# Patient Record
Sex: Female | Born: 1955
Health system: Southern US, Community
[De-identification: ages and names within clinical notes are randomized; demographics above are authoritative.]

## PROBLEM LIST (undated history)

## (undated) DIAGNOSIS — M8580 Other specified disorders of bone density and structure, unspecified site: Secondary | ICD-10-CM

## (undated) DIAGNOSIS — H269 Unspecified cataract: Secondary | ICD-10-CM

## (undated) DIAGNOSIS — I10 Essential (primary) hypertension: Secondary | ICD-10-CM

## (undated) DIAGNOSIS — K219 Gastro-esophageal reflux disease without esophagitis: Secondary | ICD-10-CM

## (undated) DIAGNOSIS — C801 Malignant (primary) neoplasm, unspecified: Secondary | ICD-10-CM

## (undated) DIAGNOSIS — Z800 Family history of malignant neoplasm of digestive organs: Secondary | ICD-10-CM

## (undated) DIAGNOSIS — R7303 Prediabetes: Secondary | ICD-10-CM

## (undated) DIAGNOSIS — M1990 Unspecified osteoarthritis, unspecified site: Secondary | ICD-10-CM

## (undated) DIAGNOSIS — E039 Hypothyroidism, unspecified: Secondary | ICD-10-CM

## (undated) DIAGNOSIS — Z803 Family history of malignant neoplasm of breast: Secondary | ICD-10-CM

## (undated) DIAGNOSIS — Z923 Personal history of irradiation: Secondary | ICD-10-CM

## (undated) DIAGNOSIS — E785 Hyperlipidemia, unspecified: Secondary | ICD-10-CM

## (undated) HISTORY — DX: Gastro-esophageal reflux disease without esophagitis: K21.9

## (undated) HISTORY — DX: Unspecified cataract: H26.9

## (undated) HISTORY — DX: Malignant (primary) neoplasm, unspecified: C80.1

## (undated) HISTORY — PX: OTHER SURGICAL HISTORY: SHX169

## (undated) HISTORY — DX: Family history of malignant neoplasm of digestive organs: Z80.0

## (undated) HISTORY — PX: TONSILLECTOMY: SUR1361

## (undated) HISTORY — DX: Essential (primary) hypertension: I10

## (undated) HISTORY — DX: Hypothyroidism, unspecified: E03.9

## (undated) HISTORY — DX: Other specified disorders of bone density and structure, unspecified site: M85.80

## (undated) HISTORY — DX: Unspecified osteoarthritis, unspecified site: M19.90

## (undated) HISTORY — DX: Hyperlipidemia, unspecified: E78.5

## (undated) HISTORY — PX: OVARIAN CYST REMOVAL: SHX89

## (undated) HISTORY — PX: COLONOSCOPY: SHX174

## (undated) HISTORY — DX: Family history of malignant neoplasm of breast: Z80.3

## (undated) HISTORY — PX: BREAST LUMPECTOMY: SHX2

---

## 1998-03-01 ENCOUNTER — Encounter: Payer: Self-pay | Admitting: Internal Medicine

## 1998-03-01 ENCOUNTER — Ambulatory Visit (HOSPITAL_COMMUNITY): Admission: RE | Admit: 1998-03-01 | Discharge: 1998-03-01 | Payer: Self-pay | Admitting: Internal Medicine

## 1999-03-20 ENCOUNTER — Encounter: Payer: Self-pay | Admitting: Internal Medicine

## 1999-03-20 ENCOUNTER — Ambulatory Visit (HOSPITAL_COMMUNITY): Admission: RE | Admit: 1999-03-20 | Discharge: 1999-03-20 | Payer: Self-pay | Admitting: Internal Medicine

## 1999-03-30 ENCOUNTER — Encounter: Payer: Self-pay | Admitting: Internal Medicine

## 1999-03-30 ENCOUNTER — Ambulatory Visit (HOSPITAL_COMMUNITY): Admission: RE | Admit: 1999-03-30 | Discharge: 1999-03-30 | Payer: Self-pay | Admitting: Internal Medicine

## 1999-05-04 ENCOUNTER — Other Ambulatory Visit: Admission: RE | Admit: 1999-05-04 | Discharge: 1999-05-04 | Payer: Self-pay | Admitting: *Deleted

## 1999-05-08 ENCOUNTER — Encounter: Payer: Self-pay | Admitting: *Deleted

## 1999-05-08 ENCOUNTER — Encounter: Admission: RE | Admit: 1999-05-08 | Discharge: 1999-05-08 | Payer: Self-pay | Admitting: *Deleted

## 1999-12-19 ENCOUNTER — Encounter: Admission: RE | Admit: 1999-12-19 | Discharge: 1999-12-19 | Payer: Self-pay | Admitting: *Deleted

## 1999-12-19 ENCOUNTER — Encounter: Payer: Self-pay | Admitting: *Deleted

## 2000-04-04 ENCOUNTER — Encounter: Payer: Self-pay | Admitting: Internal Medicine

## 2000-04-04 ENCOUNTER — Ambulatory Visit (HOSPITAL_COMMUNITY): Admission: RE | Admit: 2000-04-04 | Discharge: 2000-04-04 | Payer: Self-pay | Admitting: Internal Medicine

## 2000-06-06 ENCOUNTER — Other Ambulatory Visit: Admission: RE | Admit: 2000-06-06 | Discharge: 2000-06-06 | Payer: Self-pay | Admitting: *Deleted

## 2000-08-23 ENCOUNTER — Encounter (INDEPENDENT_AMBULATORY_CARE_PROVIDER_SITE_OTHER): Payer: Self-pay | Admitting: Specialist

## 2000-08-23 ENCOUNTER — Other Ambulatory Visit: Admission: RE | Admit: 2000-08-23 | Discharge: 2000-08-23 | Payer: Self-pay | Admitting: *Deleted

## 2001-04-11 ENCOUNTER — Encounter: Payer: Self-pay | Admitting: Internal Medicine

## 2001-04-11 ENCOUNTER — Ambulatory Visit (HOSPITAL_COMMUNITY): Admission: RE | Admit: 2001-04-11 | Discharge: 2001-04-11 | Payer: Self-pay | Admitting: Internal Medicine

## 2001-06-11 ENCOUNTER — Other Ambulatory Visit: Admission: RE | Admit: 2001-06-11 | Discharge: 2001-06-11 | Payer: Self-pay | Admitting: *Deleted

## 2002-04-13 ENCOUNTER — Ambulatory Visit (HOSPITAL_COMMUNITY): Admission: RE | Admit: 2002-04-13 | Discharge: 2002-04-13 | Payer: Self-pay | Admitting: *Deleted

## 2002-04-13 ENCOUNTER — Encounter: Payer: Self-pay | Admitting: *Deleted

## 2002-07-02 ENCOUNTER — Other Ambulatory Visit: Admission: RE | Admit: 2002-07-02 | Discharge: 2002-07-02 | Payer: Self-pay | Admitting: *Deleted

## 2003-04-14 ENCOUNTER — Ambulatory Visit (HOSPITAL_COMMUNITY): Admission: RE | Admit: 2003-04-14 | Discharge: 2003-04-14 | Payer: Self-pay | Admitting: *Deleted

## 2003-07-20 ENCOUNTER — Other Ambulatory Visit: Admission: RE | Admit: 2003-07-20 | Discharge: 2003-07-20 | Payer: Self-pay | Admitting: *Deleted

## 2004-04-14 ENCOUNTER — Ambulatory Visit (HOSPITAL_COMMUNITY): Admission: RE | Admit: 2004-04-14 | Discharge: 2004-04-14 | Payer: Self-pay | Admitting: *Deleted

## 2004-04-28 ENCOUNTER — Encounter: Admission: RE | Admit: 2004-04-28 | Discharge: 2004-04-28 | Payer: Self-pay | Admitting: *Deleted

## 2004-07-26 ENCOUNTER — Other Ambulatory Visit: Admission: RE | Admit: 2004-07-26 | Discharge: 2004-07-26 | Payer: Self-pay | Admitting: *Deleted

## 2004-10-27 ENCOUNTER — Encounter: Admission: RE | Admit: 2004-10-27 | Discharge: 2004-10-27 | Payer: Self-pay | Admitting: *Deleted

## 2005-04-16 ENCOUNTER — Encounter: Admission: RE | Admit: 2005-04-16 | Discharge: 2005-04-16 | Payer: Self-pay | Admitting: *Deleted

## 2005-08-01 ENCOUNTER — Other Ambulatory Visit: Admission: RE | Admit: 2005-08-01 | Discharge: 2005-08-01 | Payer: Self-pay | Admitting: *Deleted

## 2005-11-21 LAB — HM COLONOSCOPY

## 2006-04-19 ENCOUNTER — Encounter: Admission: RE | Admit: 2006-04-19 | Discharge: 2006-04-19 | Payer: Self-pay | Admitting: *Deleted

## 2006-08-20 ENCOUNTER — Other Ambulatory Visit: Admission: RE | Admit: 2006-08-20 | Discharge: 2006-08-20 | Payer: Self-pay | Admitting: *Deleted

## 2007-04-21 ENCOUNTER — Encounter: Admission: RE | Admit: 2007-04-21 | Discharge: 2007-04-21 | Payer: Self-pay | Admitting: *Deleted

## 2008-04-30 ENCOUNTER — Encounter: Admission: RE | Admit: 2008-04-30 | Discharge: 2008-04-30 | Payer: Self-pay | Admitting: Obstetrics & Gynecology

## 2009-05-02 ENCOUNTER — Encounter: Admission: RE | Admit: 2009-05-02 | Discharge: 2009-05-02 | Payer: Self-pay | Admitting: Obstetrics & Gynecology

## 2009-10-05 ENCOUNTER — Encounter: Admission: RE | Admit: 2009-10-05 | Discharge: 2009-10-05 | Payer: Self-pay | Admitting: Obstetrics & Gynecology

## 2010-03-27 ENCOUNTER — Other Ambulatory Visit: Payer: Self-pay | Admitting: Obstetrics & Gynecology

## 2010-03-27 DIAGNOSIS — Z1231 Encounter for screening mammogram for malignant neoplasm of breast: Secondary | ICD-10-CM

## 2010-04-26 ENCOUNTER — Encounter: Payer: Self-pay | Admitting: Family Medicine

## 2010-04-26 ENCOUNTER — Ambulatory Visit (INDEPENDENT_AMBULATORY_CARE_PROVIDER_SITE_OTHER): Payer: 59 | Admitting: Family Medicine

## 2010-04-26 DIAGNOSIS — E785 Hyperlipidemia, unspecified: Secondary | ICD-10-CM | POA: Insufficient documentation

## 2010-04-26 DIAGNOSIS — K219 Gastro-esophageal reflux disease without esophagitis: Secondary | ICD-10-CM | POA: Insufficient documentation

## 2010-04-26 DIAGNOSIS — E039 Hypothyroidism, unspecified: Secondary | ICD-10-CM | POA: Insufficient documentation

## 2010-04-26 DIAGNOSIS — Z136 Encounter for screening for cardiovascular disorders: Secondary | ICD-10-CM

## 2010-04-26 DIAGNOSIS — I10 Essential (primary) hypertension: Secondary | ICD-10-CM | POA: Insufficient documentation

## 2010-04-26 LAB — HEPATIC FUNCTION PANEL
ALT: 41 U/L — ABNORMAL HIGH (ref 0–35)
AST: 33 U/L (ref 0–37)
Bilirubin, Direct: 0.1 mg/dL (ref 0.0–0.3)
Total Bilirubin: 1.2 mg/dL (ref 0.3–1.2)

## 2010-04-26 LAB — BASIC METABOLIC PANEL
BUN: 13 mg/dL (ref 6–23)
Calcium: 9.6 mg/dL (ref 8.4–10.5)
Creatinine, Ser: 0.8 mg/dL (ref 0.4–1.2)
GFR: 75.29 mL/min (ref >60.00)
Glucose, Bld: 91 mg/dL (ref 70–99)

## 2010-04-26 MED ORDER — OMEPRAZOLE 40 MG PO CPDR: 40.0000 mg | DELAYED_RELEASE_CAPSULE | Freq: Every day | ORAL | Status: DC

## 2010-04-26 NOTE — Assessment & Plan Note (Signed)
Asymptomatic. Continue current dose of synthroid. Recheck TSH today.

## 2010-04-26 NOTE — Assessment & Plan Note (Signed)
Continue current dose of Amlodipine. BMET today.

## 2010-04-26 NOTE — Assessment & Plan Note (Signed)
Deteriorated. Will refer to her gastroenterologist, Dr. Madilyn Fireman. Restart Omeprazole.

## 2010-04-26 NOTE — Progress Notes (Signed)
55 yo here to establish care.    GERD- feels like it is getting worse.  Per pt, had endoscopy 5 years ago that was normal. Used to take Omeprazole but stopped taking it.  Has symptoms almost daily, usually worse after drinking Tea or eating spicy foods. No n/v/d or changes in her stool.  HTN- has been stable on Amlodipine 10 mg daily for over 10 years.  Was on lisinopril as well but became hypotensive.  Pt reports that she has been normotensive but is nervous about visit today.  Hypothyroidism- has been stable on current dose of synthroid for several years.  Denies any symptoms of hypo or hyperthyroidism.  Has OBGYN, Dr. Seymour Bars.  Mammogram scheduled for next week.  The PMH, PSH, Social History, Family History, Medications, and allergies have been reviewed in [facility], and have been updated if relevant.  General: Denies fever, chills, sweats. No significant weight loss. Eyes: Denies blurring,significant itching ENT: Denies earache, sore throat, and hoarseness.  Cardiovascular: Denies chest pains, palpitations, dyspnea on exertion,  Respiratory: Denies cough, dyspnea at rest,wheeezing Breast: no concerns about lumps GI: Denies nausea, vomiting, diarrhea, constipation, change in bowel habits, abdominal pain, melena, hematochezia GU: Denies dysuria, hematuria, urinary hesitancy, nocturia, denies STD risk, no concerns about discharge Musculoskeletal: Denies back pain, joint pain Derm: Denies rash, itching Neuro: Denies  paresthesias, frequent falls, frequent headaches Psych: Denies depression, anxiety Endocrine: Denies cold intolerance, heat intolerance, polydipsia Heme: Denies enlarged lymph nodes Allergy: No hayfever  Physical exam BP 140/80  Pulse 60  Temp(Src) 98.8 F (37.1 C) (Oral)  Ht 5\' 8"  (1.727 m)  Wt 220 lb 1.9 oz (99.846 kg)  BMI 33.47 kg/m2  General:  Overweight appearing, pleasant female,in no acute distress; alert,appropriate and cooperative throughout examination Head:   normocephalic and atraumatic.   Eyes:  vision grossly intact, pupils equal, pupils round, and pupils reactive to light.   Ears:  R ear normal and L ear normal.   Nose:  no external deformity.   Mouth:  good dentition.   Neck:  No deformities, masses, or tenderness noted. Lungs:  Normal respiratory effort, chest expands symmetrically. Lungs are clear to auscultation, no crackles or wheezes. Heart:  Normal rate and regular rhythm. S1 and S2 normal without gallop, murmur, click, rub or other extra sounds. Abdomen:  Bowel sounds positive,abdomen soft and non-tender without masses, organomegaly or hernias noted. Msk:  No deformity or scoliosis noted of thoracic or lumbar spine.   Extremities:  No clubbing, cyanosis, edema, or deformity noted with normal full range of motion of all joints.   Neurologic:  alert & oriented X3 and gait normal.   Skin:  Intact without suspicious lesions or rashes Psych:  Cognition and judgment appear intact. Alert and cooperative with normal attention span and concentration. No apparent delusions, illusions, hallucinations

## 2010-04-26 NOTE — Patient Instructions (Signed)
Great to meet you. Please stop by to see Elizabeth Fitzpatrick on your way out. 

## 2010-04-26 NOTE — Assessment & Plan Note (Signed)
On Zocor. Recheck FLP, hepatic panel today.

## 2010-05-04 ENCOUNTER — Ambulatory Visit
Admission: RE | Admit: 2010-05-04 | Discharge: 2010-05-04 | Disposition: A | Discharge status: Home / Self Care | Payer: 59 | Source: Ambulatory Visit | Attending: Obstetrics & Gynecology | Admitting: Obstetrics & Gynecology

## 2010-05-04 DIAGNOSIS — Z1231 Encounter for screening mammogram for malignant neoplasm of breast: Secondary | ICD-10-CM

## 2010-07-11 ENCOUNTER — Other Ambulatory Visit: Payer: Self-pay | Admitting: *Deleted

## 2010-07-11 MED ORDER — AMLODIPINE BESYLATE 10 MG PO TABS: 10.0000 mg | ORAL_TABLET | Freq: Every day | ORAL | Status: DC

## 2010-08-10 ENCOUNTER — Ambulatory Visit (INDEPENDENT_AMBULATORY_CARE_PROVIDER_SITE_OTHER): Payer: 59 | Admitting: Family Medicine

## 2010-08-10 ENCOUNTER — Encounter: Payer: Self-pay | Admitting: Family Medicine

## 2010-08-10 VITALS — BP 122/80 | HR 59 | Temp 97.3°F | Ht 68.0 in | Wt 207.5 lb

## 2010-08-10 DIAGNOSIS — E785 Hyperlipidemia, unspecified: Secondary | ICD-10-CM

## 2010-08-10 DIAGNOSIS — R233 Spontaneous ecchymoses: Secondary | ICD-10-CM

## 2010-08-10 DIAGNOSIS — I10 Essential (primary) hypertension: Secondary | ICD-10-CM

## 2010-08-10 DIAGNOSIS — Z23 Encounter for immunization: Secondary | ICD-10-CM

## 2010-08-10 LAB — HEPATIC FUNCTION PANEL
Albumin: 5 g/dL (ref 3.5–5.2)
Alkaline Phosphatase: 77 U/L (ref 39–117)
Bilirubin, Direct: 0.1 mg/dL (ref 0.0–0.3)

## 2010-08-10 LAB — CBC WITH DIFFERENTIAL/PLATELET
Basophils Absolute: 0 10*3/uL (ref 0.0–0.1)
Basophils Relative: 0.6 % (ref 0.0–3.0)
HCT: 45.7 % (ref 36.0–46.0)
Hemoglobin: 15.6 g/dL — ABNORMAL HIGH (ref 12.0–15.0)
Lymphs Abs: 1.6 10*3/uL (ref 0.7–4.0)
MCHC: 34.1 g/dL (ref 30.0–36.0)
Monocytes Relative: 11.1 % (ref 3.0–12.0)
Neutro Abs: 3.4 10*3/uL (ref 1.4–7.7)
RBC: 4.9 Mil/uL (ref 3.87–5.11)
RDW: 12.2 % (ref 11.5–14.6)

## 2010-08-10 LAB — LIPID PANEL
Total CHOL/HDL Ratio: 4
VLDL: 21 mg/dL (ref 0.0–40.0)

## 2010-08-10 NOTE — Progress Notes (Signed)
Addended by: Gilmer Mor on: 08/10/2010 08:36 AM   Modules accepted: Orders

## 2010-08-10 NOTE — Progress Notes (Signed)
55 yo here for 3 month follow up.   HTN- has been stable on Amlodipine 10 mg daily for over 10 years.  Was on lisinopril as well but became hypotensive.   Was a little hypertensive at last office visit. BP Readings from Last 3 Encounters:  04/26/10 140/80    Wt Readings from Last 3 Encounters:  08/10/10 207 lb 8 oz (94.121 kg)  04/26/10 220 lb 1.9 oz (99.846 kg)    Hypothyroidism- has been stable on current dose of synthroid for several years.  Denies any symptoms of hypo or hyperthyroidism. Lab Results  Component Value Date   TSH 3.03 04/26/2010    HLD- lipids were elevated in April. LDL 163, had not been taking her Zocor regularly. Restarted since her last office visit.  Red dots- noticed red dots on her legs bilaterally for several months. Not itchy or painful. No bleeding from nose, in urine or stool.  Patient Active Problem List  Diagnoses  . GERD (gastroesophageal reflux disease)  . Hypertension  . Hypothyroidism  . Hyperlipidemia   Past Medical History  Diagnosis Date  . GERD (gastroesophageal reflux disease)   . Hyperlipidemia   . Hypertension   . Hypothyroidism    Past Surgical History  Procedure Date  . Pilonidal cyst removal 1980s  . Tonsillectomy   . Ovarian cyst removal    History  Substance Use Topics  . Smoking status: Never Smoker   . Smokeless tobacco: Not on file  . Alcohol Use: Not on file   No family history on file. Allergies  Allergen Reactions  . Sulfa Antibiotics Hives and Swelling   Current Outpatient Prescriptions on File Prior to Visit  Medication Sig Dispense Refill  . amLODipine (NORVASC) 10 MG tablet Take 1 tablet (10 mg total) by mouth daily.  7 tablet  0  . Azelaic Acid (FINACEA EX) Use as directed       . Calcium-Vitamin D-Vitamin K (CALCIUM SOFT CHEWS) 500-100-40 MG-UNT-MCG CHEW Chew 1 tablet by mouth daily.        . Levothyroxine Sodium 75 MCG CAPS Take 1 capsule by mouth daily.        . Multiple Vitamin (MULTIVITAMINS  PO) Take 1 tablet by mouth daily.        Marland Kitchen omeprazole (PRILOSEC) 40 MG capsule Take 1 capsule (40 mg total) by mouth daily.  90 capsule  3  . simvastatin (ZOCOR) 20 MG tablet Take 20 mg by mouth at bedtime.         The PMH, PSH, Social History, Family History, Medications, and allergies have been reviewed in [facility], and have been updated if relevant.   ROS: General: Denies fever, chills, sweats. No significant weight loss. Eyes: Denies blurring,significant itching ENT: Denies earache, sore throat, and hoarseness.  Cardiovascular: Denies chest pains, palpitations, dyspnea on exertion,  Respiratory: Denies cough, dyspnea at rest,wheeezing Breast: no concerns about lumps GI: Denies nausea, vomiting, diarrhea, constipation, change in bowel habits, abdominal pain, melena, hematochezia GU: Denies dysuria, hematuria, urinary hesitancy, nocturia, denies STD risk, no concerns about discharge Musculoskeletal: Denies back pain, joint pain Neuro: Denies  paresthesias, frequent falls, frequent headaches Psych: Denies depression, anxiety Endocrine: Denies cold intolerance, heat intolerance, polydipsia Heme: Denies enlarged lymph nodes Allergy: No hayfever  Physical exam BP 122/80  Pulse 59  Temp(Src) 97.3 F (36.3 C) (Oral)  Ht 5\' 8"  (1.727 m)  Wt 207 lb 8 oz (94.121 kg)  BMI 31.55 kg/m2  General:  Overweight appearing, pleasant female,in no acute  distress; alert,appropriate and cooperative throughout examination Head:  normocephalic and atraumatic.   Eyes:  vision grossly intact, pupils equal, pupils round, and pupils reactive to light.   Ears:  R ear normal and L ear normal.   Nose:  no external deformity.   Mouth:  good dentition.   Neck:  No deformities, masses, or tenderness noted. Lungs:  Normal respiratory effort, chest expands symmetrically. Lungs are clear to auscultation, no crackles or wheezes. Heart:  Normal rate and regular rhythm. S1 and S2 normal without gallop, murmur, click,  rub or other extra sounds. Abdomen:  Bowel sounds positive,abdomen soft and non-tender without masses, organomegaly or hernias noted. Msk:  No deformity or scoliosis noted of thoracic or lumbar spine.   Extremities:  No clubbing, cyanosis, edema, or deformity noted with normal full range of motion of all joints.   Neurologic:  alert & oriented X3 and gait normal.   Skin:  Intact without suspicious lesions or rashes, +non blanchable petechia Psych:  Cognition and judgment appear intact. Alert and cooperative with normal attention span and concentration. No apparent delusions, illusions, hallucinations  Assessment and Plan: 1. Hyperlipidemia  Recheck lipid panel, hepatic panel today.  2. Hypertension  Improved.  Continue current meds.   3. Petechiae  CBC w/Diff  Etiology unclear.  Check CBC today.

## 2010-10-17 ENCOUNTER — Telehealth: Payer: Self-pay | Admitting: *Deleted

## 2010-10-17 NOTE — Telephone Encounter (Signed)
Yes ok to check yearly.  If she has made changes in her diet or she is concerned, we can check every 6 months

## 2010-10-17 NOTE — Telephone Encounter (Signed)
Pt states she has been on simvastatin for 4-5 years.  She had her lipids and liver functions checked when she came here as a new patient in July.  She asks how often she should have that checked, she thinks every 3-6 months, I advised her ok to check yearly after the initial 6 week check.  Please advise.

## 2010-10-17 NOTE — Telephone Encounter (Signed)
Left message on personal voicemail at work advising patient as instructed.

## 2011-01-18 ENCOUNTER — Other Ambulatory Visit: Payer: Self-pay | Admitting: Internal Medicine

## 2011-01-18 MED ORDER — SIMVASTATIN 20 MG PO TABS: 20.0000 mg | ORAL_TABLET | Freq: Every day | ORAL | Status: DC

## 2011-01-18 MED ORDER — AMLODIPINE BESYLATE 10 MG PO TABS: 10.0000 mg | ORAL_TABLET | Freq: Every day | ORAL | Status: DC

## 2011-01-18 MED ORDER — LEVOTHYROXINE SODIUM 75 MCG PO CAPS: 1.0000 | ORAL_CAPSULE | Freq: Every day | ORAL | Status: DC

## 2011-01-18 NOTE — Telephone Encounter (Signed)
Refill request sent 90 day supply to [facility] and because it takes 7 to 14 days for patient to receive medications I sent 14 days worth to local pharmacy.

## 2011-01-31 ENCOUNTER — Other Ambulatory Visit: Payer: Self-pay | Admitting: Internal Medicine

## 2011-01-31 MED ORDER — LEVOTHYROXINE SODIUM 75 MCG PO CAPS: 1.0000 | ORAL_CAPSULE | Freq: Every day | ORAL | Status: DC

## 2011-01-31 NOTE — Telephone Encounter (Signed)
Sent Rx to pharmacy  

## 2011-02-09 ENCOUNTER — Other Ambulatory Visit: Payer: Self-pay | Admitting: *Deleted

## 2011-02-09 MED ORDER — LEVOTHYROXINE SODIUM 75 MCG PO CAPS: 1.0000 | ORAL_CAPSULE | Freq: Every day | ORAL | Status: DC

## 2011-02-09 NOTE — Telephone Encounter (Signed)
Patient called to request a 14 day supply of Levothyroxine be sent to [facility], she also stated that she has not received her 90 day supply from [facility].  I called [facility] and spoke to a pharmacist and she stated that patient has been receiving tablets and we sent in a Rx for capsules and they wanted to make sure patient can take the tablets.  Tablets authorized, patient should be getting her Rx sent out on Tuesday.

## 2011-03-29 ENCOUNTER — Other Ambulatory Visit: Payer: Self-pay | Admitting: Obstetrics

## 2011-03-29 DIAGNOSIS — Z1231 Encounter for screening mammogram for malignant neoplasm of breast: Secondary | ICD-10-CM

## 2011-05-07 ENCOUNTER — Ambulatory Visit: Payer: 59

## 2011-05-08 ENCOUNTER — Ambulatory Visit
Admission: RE | Admit: 2011-05-08 | Discharge: 2011-05-08 | Disposition: A | Discharge status: Home / Self Care | Payer: 59 | Source: Ambulatory Visit | Attending: Obstetrics | Admitting: Obstetrics

## 2011-05-08 DIAGNOSIS — Z1231 Encounter for screening mammogram for malignant neoplasm of breast: Secondary | ICD-10-CM

## 2011-08-15 ENCOUNTER — Ambulatory Visit (INDEPENDENT_AMBULATORY_CARE_PROVIDER_SITE_OTHER): Payer: 59 | Admitting: Family Medicine

## 2011-08-15 ENCOUNTER — Encounter: Payer: Self-pay | Admitting: Family Medicine

## 2011-08-15 VITALS — BP 136/80 | HR 80 | Temp 98.0°F | Ht 68.0 in | Wt 218.0 lb

## 2011-08-15 DIAGNOSIS — I10 Essential (primary) hypertension: Secondary | ICD-10-CM

## 2011-08-15 DIAGNOSIS — E785 Hyperlipidemia, unspecified: Secondary | ICD-10-CM

## 2011-08-15 DIAGNOSIS — Z0000 Encounter for general adult medical examination without abnormal findings: Secondary | ICD-10-CM | POA: Insufficient documentation

## 2011-08-15 DIAGNOSIS — E039 Hypothyroidism, unspecified: Secondary | ICD-10-CM

## 2011-08-15 LAB — TSH: TSH: 2.58 u[IU]/mL (ref 0.35–5.50)

## 2011-08-15 LAB — COMPREHENSIVE METABOLIC PANEL
ALT: 36 U/L — ABNORMAL HIGH (ref 0–35)
AST: 29 U/L (ref 0–37)
Alkaline Phosphatase: 67 U/L (ref 39–117)
Creatinine, Ser: 0.8 mg/dL (ref 0.4–1.2)
Total Bilirubin: 1 mg/dL (ref 0.3–1.2)

## 2011-08-15 LAB — LIPID PANEL
HDL: 47.6 mg/dL (ref >39.00)
Triglycerides: 164 mg/dL — ABNORMAL HIGH (ref 0.0–149.0)
VLDL: 32.8 mg/dL (ref 0.0–40.0)

## 2011-08-15 LAB — LDL CHOLESTEROL, DIRECT: Direct LDL: 129.1 mg/dL

## 2011-08-15 MED ORDER — AMLODIPINE BESYLATE 10 MG PO TABS: 10.0000 mg | ORAL_TABLET | Freq: Every day | ORAL | Status: DC

## 2011-08-15 MED ORDER — SIMVASTATIN 20 MG PO TABS: 20.0000 mg | ORAL_TABLET | Freq: Every day | ORAL | Status: DC

## 2011-08-15 MED ORDER — OMEPRAZOLE 40 MG PO CPDR: 40.0000 mg | DELAYED_RELEASE_CAPSULE | Freq: Every day | ORAL | Status: DC

## 2011-08-15 MED ORDER — LEVOTHYROXINE SODIUM 75 MCG PO CAPS: 1.0000 | ORAL_CAPSULE | Freq: Every day | ORAL | Status: DC

## 2011-08-15 NOTE — Progress Notes (Signed)
56 yo here for CPX, no pap.  Sees Dr. Ernestina Penna in the fall.  Had a colonoscopy a few years ago- sees Dr. Madilyn Fireman.   HTN- has been stable on Amlodipine 10 mg daily for over 10 years.  Was on lisinopril as well but became hypotensive.    BP Readings from Last 3 Encounters:  08/10/10 122/80  04/26/10 140/80    Wt Readings from Last 3 Encounters:  08/10/10 207 lb 8 oz (94.121 kg)  04/26/10 220 lb 1.9 oz (99.846 kg)    Hypothyroidism- has been stable on current dose of synthroid for several years.  Denies any symptoms of hypo or hyperthyroidism. Lab Results  Component Value Date   TSH 3.03 04/26/2010    HLD- on zocor. Lab Results  Component Value Date   CHOL 214* 08/10/2010   HDL 54.10 08/10/2010   LDLDIRECT 148.4 08/10/2010   TRIG 105.0 08/10/2010   CHOLHDL 4 08/10/2010  Obesity- has tried every diet.  She is aware that she has an addicition to food but knows what she needs to do. Does not want medication and nutrition referral.  Patient Active Problem List  Diagnosis  . GERD (gastroesophageal reflux disease)  . Hypertension  . Hypothyroidism  . Hyperlipidemia  . Petechiae  . Routine general medical examination at a health care facility   Past Medical History  Diagnosis Date  . GERD (gastroesophageal reflux disease)   . Hyperlipidemia   . Hypertension   . Hypothyroidism    Past Surgical History  Procedure Date  . Pilonidal cyst removal 1980s  . Tonsillectomy   . Ovarian cyst removal    History  Substance Use Topics  . Smoking status: Never Smoker   . Smokeless tobacco: Not on file  . Alcohol Use: Not on file   No family history on file. Allergies  Allergen Reactions  . Sulfa Antibiotics Hives and Swelling   Current Outpatient Prescriptions on File Prior to Visit  Medication Sig Dispense Refill  . amLODipine (NORVASC) 10 MG tablet Take 1 tablet (10 mg total) by mouth daily.  90 tablet  3  . amLODipine (NORVASC) 10 MG tablet Take 1 tablet (10 mg total) by mouth  daily.  14 tablet  0  . Azelaic Acid (FINACEA EX) Use as directed       . Calcium-Vitamin D-Vitamin K (CALCIUM SOFT CHEWS) 500-100-40 MG-UNT-MCG CHEW Chew 1 tablet by mouth daily.        . Levothyroxine Sodium 75 MCG CAPS Take 1 capsule (75 mcg total) by mouth daily.  14 capsule  0  . Multiple Vitamin (MULTIVITAMINS PO) Take 1 tablet by mouth daily.        Marland Kitchen omeprazole (PRILOSEC) 40 MG capsule Take 1 capsule (40 mg total) by mouth daily.  90 capsule  3  . simvastatin (ZOCOR) 20 MG tablet Take 1 tablet (20 mg total) by mouth at bedtime.  90 tablet  3  . simvastatin (ZOCOR) 20 MG tablet Take 1 tablet (20 mg total) by mouth at bedtime.  14 tablet  0   The PMH, PSH, Social History, Family History, Medications, and allergies have been reviewed in [facility], and have been updated if relevant.   ROS:  Physical exam BP 136/80  Pulse 80  Temp 98 F (36.7 C)  Ht 5\' 8"  (1.727 m)  Wt 218 lb (98.884 kg)  BMI 33.15 kg/m2   General:  Well-developed,well-nourished,in no acute distress; alert,appropriate and cooperative throughout examination Head:  normocephalic and atraumatic.   Eyes:  vision grossly intact, pupils equal, pupils round, and pupils reactive to light.   Ears:  R ear normal and L ear normal.   Nose:  no external deformity.   Mouth:  good dentition.   Lungs:  Normal respiratory effort, chest expands symmetrically. Lungs are clear to auscultation, no crackles or wheezes. Heart:  Normal rate and regular rhythm. S1 and S2 normal without gallop, murmur, click, rub or other extra sounds. Abdomen:  Bowel sounds positive,abdomen soft and non-tender without masses, organomegaly or hernias noted. Small firm, palpable subcutaneous nodule RLQ abdomen (per pt has been there for months to years). Msk:  No deformity or scoliosis noted of thoracic or lumbar spine.   Extremities:  No clubbing, cyanosis, edema, or deformity noted with normal full range of motion of all joints.   Neurologic:  alert &  oriented X3 and gait normal.   Skin:  Intact without suspicious lesions or rashes Cervical Nodes:  No lymphadenopathy noted Axillary Nodes:  No palpable lymphadenopathy Psych:  Cognition and judgment appear intact. Alert and cooperative with normal attention span and concentration. No apparent delusions, illusions, hallucinations   Assessment and Plan:  1. Routine general medical examination at a health care facility  Reviewed preventive care protocols, scheduled due services, and updated immunizations Discussed nutrition, exercise, diet, and healthy lifestyle.  Comprehensive metabolic panel  2. Hyperlipidemia  On zocor. Recheck labs today. Lipid Panel  3. Hypothyroidism  On levothyroxine. Recheck labs today. TSH  4. Hypertension  Stable on amlodipine.

## 2011-08-15 NOTE — Patient Instructions (Addendum)
Great to see you.  We will call you with your lab results. 

## 2011-08-21 ENCOUNTER — Encounter: Payer: Self-pay | Admitting: Family Medicine

## 2012-01-28 ENCOUNTER — Telehealth: Payer: Self-pay

## 2012-01-28 NOTE — Telephone Encounter (Signed)
Pt left v/m has been talking with [facility] billing dept re: wellness exam done on 2010/05/23. Pt said visit was not coded correctly and request Dr Elmer Sow representative to call [facility] billing  Jonette Pesa at [phone] ext [phone].Please advise.

## 2012-01-31 NOTE — Telephone Encounter (Signed)
Spoke with the patient for more information.  Asked Dr. Dayton Martes if any visits in 2012 were documented for a CPE to be charged.  She agreed that the July 19,2012 visit could be classified as a CPE.  E-mailed charge correction and asked that a CPE CPT code be billed and a CPE dx. Called patient back to notify her the change had been made.

## 2012-04-08 ENCOUNTER — Other Ambulatory Visit: Payer: Self-pay | Admitting: Obstetrics

## 2012-04-08 ENCOUNTER — Other Ambulatory Visit: Payer: Self-pay

## 2012-04-08 DIAGNOSIS — Z1231 Encounter for screening mammogram for malignant neoplasm of breast: Secondary | ICD-10-CM

## 2012-05-15 ENCOUNTER — Ambulatory Visit: Payer: 59

## 2012-05-19 ENCOUNTER — Ambulatory Visit
Admission: RE | Admit: 2012-05-19 | Discharge: 2012-05-19 | Disposition: A | Discharge status: Home / Self Care | Payer: 59 | Source: Ambulatory Visit

## 2012-05-19 DIAGNOSIS — Z1231 Encounter for screening mammogram for malignant neoplasm of breast: Secondary | ICD-10-CM

## 2012-09-17 ENCOUNTER — Ambulatory Visit (INDEPENDENT_AMBULATORY_CARE_PROVIDER_SITE_OTHER): Payer: 59 | Admitting: Family Medicine

## 2012-09-17 ENCOUNTER — Encounter: Payer: Self-pay | Admitting: Family Medicine

## 2012-09-17 VITALS — BP 140/80 | HR 64 | Temp 98.1°F | Ht 68.0 in | Wt 209.0 lb

## 2012-09-17 DIAGNOSIS — Z0000 Encounter for general adult medical examination without abnormal findings: Secondary | ICD-10-CM

## 2012-09-17 DIAGNOSIS — E785 Hyperlipidemia, unspecified: Secondary | ICD-10-CM

## 2012-09-17 DIAGNOSIS — E039 Hypothyroidism, unspecified: Secondary | ICD-10-CM

## 2012-09-17 DIAGNOSIS — I10 Essential (primary) hypertension: Secondary | ICD-10-CM

## 2012-09-17 DIAGNOSIS — K219 Gastro-esophageal reflux disease without esophagitis: Secondary | ICD-10-CM

## 2012-09-17 DIAGNOSIS — Z1211 Encounter for screening for malignant neoplasm of colon: Secondary | ICD-10-CM

## 2012-09-17 LAB — CBC WITH DIFFERENTIAL/PLATELET
Basophils Relative: 0.7 % (ref 0.0–3.0)
Eosinophils Absolute: 0.3 10*3/uL (ref 0.0–0.7)
Eosinophils Relative: 4.6 % (ref 0.0–5.0)
Lymphocytes Relative: 24.5 % (ref 12.0–46.0)
MCHC: 34.8 g/dL (ref 30.0–36.0)
Monocytes Relative: 9.9 % (ref 3.0–12.0)
Neutrophils Relative %: 60.3 % (ref 43.0–77.0)
RBC: 5.03 Mil/uL (ref 3.87–5.11)
WBC: 6.6 10*3/uL (ref 4.5–10.5)

## 2012-09-17 MED ORDER — OMEPRAZOLE 40 MG PO CPDR
40.0000 mg | DELAYED_RELEASE_CAPSULE | Freq: Every day | ORAL | Status: DC
Start: 2012-09-17 — End: 2013-12-10

## 2012-09-17 MED ORDER — OMEPRAZOLE 40 MG PO CPDR: 40.0000 mg | DELAYED_RELEASE_CAPSULE | Freq: Every day | ORAL | Status: DC

## 2012-09-17 MED ORDER — SIMVASTATIN 20 MG PO TABS: 20.0000 mg | ORAL_TABLET | Freq: Every day | ORAL | Status: DC

## 2012-09-17 NOTE — Patient Instructions (Addendum)
Great to see you. We will call you with your lab results and you can view them online.  Please pick up your stool cards.

## 2012-09-17 NOTE — Progress Notes (Signed)
57 yo pleasant female here for CPX, no pap.  Sees Dr. Ernestina Penna in the fall.  Had a colonoscopy a few years ago (before she turned 18)- sees Dr. Madilyn Fireman.  Was told she did not have to have another one for 10 years.  She denies any blood in her stool or changes in her bowel habits.  Bowels are very regular.  HTN- has been stable on Amlodipine 10 mg daily for over 10 years.  Was on lisinopril as well but became hypotensive.     Hypothyroidism- has been stable on current dose of synthroid for several years.  Denies any symptoms of hypo or hyperthyroidism. Lab Results  Component Value Date   TSH 2.58 08/15/2011    HLD- on zocor but ran out several months ago. Lab Results  Component Value Date   CHOL 216* 08/15/2011   HDL 47.60 08/15/2011   LDLDIRECT 129.1 08/15/2011   TRIG 164.0* 08/15/2011   CHOLHDL 5 08/15/2011   Patient Active Problem List   Diagnosis Date Noted  . Routine general medical examination at a health care facility 08/15/2011  . Petechiae 08/10/2010  . GERD (gastroesophageal reflux disease) 04/26/2010  . Hypertension 04/26/2010  . Hypothyroidism 04/26/2010  . Hyperlipidemia 04/26/2010   Past Medical History  Diagnosis Date  . GERD (gastroesophageal reflux disease)   . Hyperlipidemia   . Hypertension   . Hypothyroidism    Past Surgical History  Procedure Laterality Date  . Pilonidal cyst removal  1980s  . Tonsillectomy    . Ovarian cyst removal     History  Substance Use Topics  . Smoking status: Never Smoker   . Smokeless tobacco: Not on file  . Alcohol Use: Not on file   No family history on file. Allergies  Allergen Reactions  . Sulfa Antibiotics Hives and Swelling  . Lisinopril     Bradycardia    Current Outpatient Prescriptions on File Prior to Visit  Medication Sig Dispense Refill  . amLODipine (NORVASC) 10 MG tablet Take 1 tablet (10 mg total) by mouth daily.  90 tablet  3  . Azelaic Acid (FINACEA EX) Use as directed       . Calcium-Vitamin  D-Vitamin K (CALCIUM SOFT CHEWS) 500-100-40 MG-UNT-MCG CHEW Chew 1 tablet by mouth daily.        Marland Kitchen levothyroxine (SYNTHROID, LEVOTHROID) 75 MCG tablet Take 75 mcg by mouth daily.      . Multiple Vitamin (MULTIVITAMINS PO) Take 1 tablet by mouth daily.        Marland Kitchen omeprazole (PRILOSEC) 40 MG capsule Take 1 capsule (40 mg total) by mouth daily.  90 capsule  3  . simvastatin (ZOCOR) 20 MG tablet Take 1 tablet (20 mg total) by mouth at bedtime.  90 tablet  3   No current facility-administered medications on file prior to visit.   The PMH, PSH, Social History, Family History, Medications, and allergies have been reviewed in [facility], and have been updated if relevant.   ROS:  Physical exam BP 140/80  Pulse 64  Temp(Src) 98.1 F (36.7 C)  Ht 5\' 8"  (1.727 m)  Wt 209 lb (94.802 kg)  BMI 31.79 kg/m2 Wt Readings from Last 3 Encounters:  09/17/12 209 lb (94.802 kg)  08/15/11 218 lb (98.884 kg)  08/10/10 207 lb 8 oz (94.121 kg)   General:  Well-developed,well-nourished,in no acute distress; alert,appropriate and cooperative throughout examination Head:  normocephalic and atraumatic.   Eyes:  vision grossly intact, pupils equal, pupils round, and pupils reactive to  light.   Ears:  R ear normal and L ear normal.   Nose:  no external deformity.   Mouth:  good dentition.   Lungs:  Normal respiratory effort, chest expands symmetrically. Lungs are clear to auscultation, no crackles or wheezes. Heart:  Normal rate and regular rhythm. S1 and S2 normal without gallop, murmur, click, rub or other extra sounds. Abdomen:  Bowel sounds positive,abdomen soft and non-tender without masses, organomegaly or hernias noted. Small firm, palpable subcutaneous nodule RLQ abdomen (per pt has been there for months to years). Msk:  No deformity or scoliosis noted of thoracic or lumbar spine.   Extremities:  No clubbing, cyanosis, edema, or deformity noted with normal full range of motion of all joints.   Neurologic:  alert  & oriented X3 and gait normal.   Skin:  Intact without suspicious lesions or rashes Cervical Nodes:  No lymphadenopathy noted Axillary Nodes:  No palpable lymphadenopathy Psych:  Cognition and judgment appear intact. Alert and cooperative with normal attention span and concentration. No apparent delusions, illusions, hallucinations   Assessment and Plan:  1. Routine general medical examination at a health care facility Reviewed preventive care protocols, scheduled due services, and updated immunizations Discussed nutrition, exercise, diet, and healthy lifestyle.  - Comprehensive metabolic panel - CBC with Differential  2. Hypothyroidism Continue current dose of synthroid.  Recheck labs today. - TSH - T4, Free  3. Hypertension Stable on current rx. - Comprehensive metabolic panel  4. Hyperlipidemia Recheck labs today, will likely be high. Zocor refilled.   - Lipid Panel  5. GERD (gastroesophageal reflux disease) Rx refilled.  6. Special screening for malignant neoplasms, colon  - Fecal occult blood, imunochemical; Future

## 2012-09-18 LAB — TSH: TSH: 3.84 u[IU]/mL (ref 0.35–5.50)

## 2012-09-18 LAB — COMPREHENSIVE METABOLIC PANEL
ALT: 31 U/L (ref 0–35)
AST: 28 U/L (ref 0–37)
Alkaline Phosphatase: 61 U/L (ref 39–117)
Creatinine, Ser: 0.9 mg/dL (ref 0.4–1.2)
Sodium: 141 mEq/L (ref 135–145)
Total Bilirubin: 0.9 mg/dL (ref 0.3–1.2)
Total Protein: 7.8 g/dL (ref 6.0–8.3)

## 2012-09-18 LAB — LIPID PANEL
HDL: 53.1 mg/dL (ref >39.00)
Total CHOL/HDL Ratio: 5
VLDL: 28.8 mg/dL (ref 0.0–40.0)

## 2012-09-18 LAB — T4, FREE: Free T4: 0.9 ng/dL (ref 0.60–1.60)

## 2012-09-26 ENCOUNTER — Other Ambulatory Visit: Payer: Self-pay | Admitting: Family Medicine

## 2012-09-29 ENCOUNTER — Encounter: Payer: Self-pay | Admitting: *Deleted

## 2012-09-29 ENCOUNTER — Other Ambulatory Visit (INDEPENDENT_AMBULATORY_CARE_PROVIDER_SITE_OTHER): Payer: 59

## 2012-09-29 ENCOUNTER — Encounter: Payer: Self-pay | Admitting: Family Medicine

## 2012-09-29 DIAGNOSIS — Z1211 Encounter for screening for malignant neoplasm of colon: Secondary | ICD-10-CM

## 2012-09-29 LAB — FECAL OCCULT BLOOD, GUAIAC: Fecal Occult Blood: NEGATIVE

## 2012-10-29 ENCOUNTER — Encounter: Payer: Self-pay | Admitting: Family Medicine

## 2013-03-24 ENCOUNTER — Other Ambulatory Visit: Payer: Self-pay | Admitting: Family Medicine

## 2013-03-26 NOTE — Telephone Encounter (Signed)
[facility] and amlodipine. Advised pt done.

## 2013-04-16 ENCOUNTER — Other Ambulatory Visit: Payer: Self-pay

## 2013-04-16 DIAGNOSIS — Z1231 Encounter for screening mammogram for malignant neoplasm of breast: Secondary | ICD-10-CM

## 2013-05-21 ENCOUNTER — Encounter (INDEPENDENT_AMBULATORY_CARE_PROVIDER_SITE_OTHER): Payer: Self-pay

## 2013-05-21 ENCOUNTER — Ambulatory Visit
Admission: RE | Admit: 2013-05-21 | Discharge: 2013-05-21 | Disposition: A | Discharge status: Home / Self Care | Payer: 59 | Source: Ambulatory Visit

## 2013-05-21 DIAGNOSIS — Z1231 Encounter for screening mammogram for malignant neoplasm of breast: Secondary | ICD-10-CM

## 2013-09-11 ENCOUNTER — Other Ambulatory Visit: Payer: Self-pay | Admitting: Family Medicine

## 2013-09-21 ENCOUNTER — Encounter: Payer: 59 | Admitting: Family Medicine

## 2013-09-24 ENCOUNTER — Ambulatory Visit (INDEPENDENT_AMBULATORY_CARE_PROVIDER_SITE_OTHER): Payer: 59 | Admitting: Family Medicine

## 2013-09-24 ENCOUNTER — Encounter: Payer: Self-pay | Admitting: Family Medicine

## 2013-09-24 VITALS — BP 132/72 | HR 59 | Temp 98.4°F | Ht 67.5 in | Wt 216.5 lb

## 2013-09-24 DIAGNOSIS — E038 Other specified hypothyroidism: Secondary | ICD-10-CM

## 2013-09-24 DIAGNOSIS — Z1211 Encounter for screening for malignant neoplasm of colon: Secondary | ICD-10-CM

## 2013-09-24 DIAGNOSIS — Z0000 Encounter for general adult medical examination without abnormal findings: Secondary | ICD-10-CM

## 2013-09-24 DIAGNOSIS — E785 Hyperlipidemia, unspecified: Secondary | ICD-10-CM

## 2013-09-24 DIAGNOSIS — Z23 Encounter for immunization: Secondary | ICD-10-CM

## 2013-09-24 DIAGNOSIS — I10 Essential (primary) hypertension: Secondary | ICD-10-CM

## 2013-09-24 LAB — CBC WITH DIFFERENTIAL/PLATELET
BASOS ABS: 0 10*3/uL (ref 0.0–0.1)
Basophils Relative: 0.7 % (ref 0.0–3.0)
Eosinophils Absolute: 0.3 10*3/uL (ref 0.0–0.7)
Eosinophils Relative: 4.6 % (ref 0.0–5.0)
HCT: 46.4 % — ABNORMAL HIGH (ref 36.0–46.0)
Hemoglobin: 15.9 g/dL — ABNORMAL HIGH (ref 12.0–15.0)
LYMPHS ABS: 1.7 10*3/uL (ref 0.7–4.0)
Lymphocytes Relative: 25.2 % (ref 12.0–46.0)
MCHC: 34.4 g/dL (ref 30.0–36.0)
MCV: 91.8 fl (ref 78.0–100.0)
Monocytes Absolute: 0.7 10*3/uL (ref 0.1–1.0)
Monocytes Relative: 10.2 % (ref 3.0–12.0)
Neutro Abs: 4 10*3/uL (ref 1.4–7.7)
Neutrophils Relative %: 59.3 % (ref 43.0–77.0)
PLATELETS: 187 10*3/uL (ref 150.0–400.0)
RBC: 5.05 Mil/uL (ref 3.87–5.11)
RDW: 12 % (ref 11.5–15.5)
WBC: 6.7 10*3/uL (ref 4.0–10.5)

## 2013-09-24 LAB — COMPREHENSIVE METABOLIC PANEL
ALK PHOS: 67 U/L (ref 39–117)
ALT: 31 U/L (ref 0–35)
AST: 26 U/L (ref 0–37)
Albumin: 4.7 g/dL (ref 3.5–5.2)
BUN: 11 mg/dL (ref 6–23)
CO2: 30 mEq/L (ref 19–32)
Calcium: 9.7 mg/dL (ref 8.4–10.5)
Chloride: 104 mEq/L (ref 96–112)
Creatinine, Ser: 1 mg/dL (ref 0.4–1.2)
GFR: 62.97 mL/min (ref >60.00)
Glucose, Bld: 97 mg/dL (ref 70–99)
Potassium: 3.8 mEq/L (ref 3.5–5.1)
SODIUM: 142 meq/L (ref 135–145)
Total Bilirubin: 0.8 mg/dL (ref 0.2–1.2)
Total Protein: 7.6 g/dL (ref 6.0–8.3)

## 2013-09-24 LAB — LIPID PANEL
Cholesterol: 181 mg/dL (ref 0–200)
HDL: 45.9 mg/dL (ref >39.00)
LDL CALC: 109 mg/dL — AB (ref 0–99)
NonHDL: 135.1
Total CHOL/HDL Ratio: 4
Triglycerides: 132 mg/dL (ref 0.0–149.0)
VLDL: 26.4 mg/dL (ref 0.0–40.0)

## 2013-09-24 LAB — TSH: TSH: 1.12 u[IU]/mL (ref 0.35–4.50)

## 2013-09-24 LAB — T4, FREE: Free T4: 0.95 ng/dL (ref 0.60–1.60)

## 2013-09-24 NOTE — Progress Notes (Signed)
58 yo pleasant female here for CPX, no pap.  Sees Dr. Pamala Hurry, next appt 10/12/2013.  Had a colonoscopy a few years ago (before she turned 46)- sees Dr. Amedeo Plenty.  Was told she did not have to have another one for 10 years.  She denies any blood in her stool or changes in her bowel habits.  Bowels are very regular. IFOB neg on 09/29/12. Mammogram 05/21/13.  HTN- has been stable on Amlodipine 10 mg daily for over 10 years.  Was on lisinopril as well but became hypotensive.     Hypothyroidism- has been stable on current dose of synthroid for several years.  Denies any symptoms of hypo or hyperthyroidism.  Overdue for labs. Lab Results  Component Value Date   TSH 3.84 09/17/2012     HLD- on zocor 20 mg daily- overdue for labs. Was very elevated last year but had not been taking her zocor in several months when labs were drawn. Lab Results  Component Value Date   CHOL 268* 09/17/2012   HDL 53.10 09/17/2012   LDLDIRECT 188.5 09/17/2012   TRIG 144.0 09/17/2012   CHOLHDL 5 09/17/2012   Lab Results  Component Value Date   ALT 31 09/17/2012   AST 28 09/17/2012   ALKPHOS 61 09/17/2012   BILITOT 0.9 09/17/2012   Lab Results  Component Value Date   WBC 6.6 09/17/2012   HGB 16.0* 09/17/2012   HCT 46.0 09/17/2012   MCV 91.4 09/17/2012   PLT 183.0 09/17/2012    Current Outpatient Prescriptions on File Prior to Visit  Medication Sig Dispense Refill  . amLODipine (NORVASC) 10 MG tablet TAKE 1 TABLET DAILY  90 tablet  0  . Azelaic Acid (FINACEA EX) Use as directed       . Calcium-Vitamin D-Vitamin K (CALCIUM SOFT CHEWS) 500-100-40 MG-UNT-MCG CHEW Chew 1 tablet by mouth daily.        Marland Kitchen levothyroxine (SYNTHROID, LEVOTHROID) 75 MCG tablet TAKE 1 TABLET DAILY  90 tablet  0  . Multiple Vitamin (MULTIVITAMINS PO) Take 1 tablet by mouth daily.        Marland Kitchen omeprazole (PRILOSEC) 40 MG capsule Take 1 capsule (40 mg total) by mouth daily.  90 capsule  3  . simvastatin (ZOCOR) 20 MG tablet Take 1 tablet (20 mg total)  by mouth at bedtime.  90 tablet  3   No current facility-administered medications on file prior to visit.    Allergies  Allergen Reactions  . Sulfa Antibiotics Hives and Swelling  . Lisinopril     Bradycardia     Past Medical History  Diagnosis Date  . GERD (gastroesophageal reflux disease)   . Hyperlipidemia   . Hypertension   . Hypothyroidism     Past Surgical History  Procedure Laterality Date  . Pilonidal cyst removal  1980s  . Tonsillectomy    . Ovarian cyst removal      No family history on file.  History   Social History  . Marital Status: Single    Spouse Name: N/A    Number of Children: 0  . 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 Smoking status: Never Smoker   . Smokeless tobacco: Not on file  . Alcohol Use: Not on file  . Drug Use: Not on file  . Sexual Activity: Not on file   Other Topics Concern  . Not on file   Social History Narrative  .  No narrative on file   The PMH, PSH, Social History, Family History, Medications, and allergies have been reviewed in [facility], and have been updated if relevant.  ROS:  See HPI No changes in bowel habits Does have more hot flashes No CP or SOB No tremors No LE edema Does wake up 2-3 times per night to urinate for past several years- no changes No blood in stool Appetite good- Wt Readings from Last 3 Encounters:  09/24/13 216 lb 8 oz (98.204 kg)  09/17/12 209 lb (94.802 kg)  08/15/11 218 lb (98.884 kg)  Walking daily at work- at least once a day.  Physical exam BP 132/72  Pulse 59  Temp(Src) 98.4 F (36.9 C) (Oral)  Ht 5' 7.5" (1.715 m)  Wt 216 lb 8 oz (98.204 kg)  BMI 33.39 kg/m2  SpO2 98% Wt Readings from Last 3 Encounters:  09/24/13 216 lb 8 oz (98.204 kg)  09/17/12 209 lb (94.802 kg)  08/15/11 218 lb (98.884 kg)   General:  Well-developed,well-nourished,in no acute distress; alert,appropriate and cooperative  throughout examination Head:  normocephalic and atraumatic.   Eyes:  vision grossly intact, pupils equal, pupils round, and pupils reactive to light.   Ears:  R ear normal and L ear normal.   Nose:  no external deformity.   Mouth:  good dentition.   Lungs:  Normal respiratory effort, chest expands symmetrically. Lungs are clear to auscultation, no crackles or wheezes. Heart:  Normal rate and regular rhythm. S1 and S2 normal without gallop, murmur, click, rub or other extra sounds. Abdomen:  Bowel sounds positive,abdomen soft and non-tender without masses, organomegaly or hernias noted. Small firm, palpable subcutaneous nodule RLQ abdomen (per pt has been there for months to years). Msk:  No deformity or scoliosis noted of thoracic or lumbar spine.   Extremities:  No clubbing, cyanosis, edema, or deformity noted with normal full range of motion of all joints.   Neurologic:  alert & oriented X3 and gait normal.   Skin:  Intact without suspicious lesions or rashes Cervical Nodes:  No lymphadenopathy noted Axillary Nodes:  No palpable lymphadenopathy Psych:  Cognition and judgment appear intact. Alert and cooperative with normal attention span and concentration. No apparent delusions, illusions, hallucinations

## 2013-09-24 NOTE — Assessment & Plan Note (Signed)
Reviewed preventive care protocols, scheduled due services, and updated immunizations Discussed nutrition, exercise, diet, and healthy lifestyle.  Influenza vaccine today. Will request copy again of colonoscopy report from Dr. Amedeo Plenty. Orders Placed This Encounter  Procedures  . Flu Vaccine QUAD 36+ mos PF IM (Fluarix Quad PF)  . CBC with Differential  . Comprehensive metabolic panel  . Lipid panel  . TSH  . T4, Free

## 2013-09-24 NOTE — Assessment & Plan Note (Signed)
Well controlled on Norvasc 10 mg daily. No changes made today. Will check CMET.

## 2013-09-24 NOTE — Progress Notes (Signed)
Pre visit review using our clinic review tool, if applicable. No additional management support is needed unless otherwise documented below in the visit note. 

## 2013-09-24 NOTE — Assessment & Plan Note (Signed)
Continue synthroid 75 mcg daily. Check thyroid panel today.

## 2013-09-24 NOTE — Addendum Note (Signed)
Addended by: Ellamae Sia on: 09/24/2013 10:15 AM   Modules accepted: Orders

## 2013-09-24 NOTE — Patient Instructions (Signed)
Great to see you. Please stop by the lab to get your stool cards.  Dr. Constance Holster and Dr. Redmond Baseman are wonderful ENTs in [place].

## 2013-09-24 NOTE — Assessment & Plan Note (Signed)
Continue zocor. Recheck lipid panel today.

## 2013-09-25 ENCOUNTER — Encounter: Payer: Self-pay | Admitting: *Deleted

## 2013-09-29 ENCOUNTER — Encounter: Payer: Self-pay | Admitting: Family Medicine

## 2013-10-05 ENCOUNTER — Encounter: Payer: Self-pay | Admitting: *Deleted

## 2013-10-05 ENCOUNTER — Other Ambulatory Visit (INDEPENDENT_AMBULATORY_CARE_PROVIDER_SITE_OTHER): Payer: 59

## 2013-10-05 DIAGNOSIS — Z1211 Encounter for screening for malignant neoplasm of colon: Secondary | ICD-10-CM

## 2013-10-05 LAB — FECAL OCCULT BLOOD, IMMUNOCHEMICAL: FECAL OCCULT BLD: NEGATIVE

## 2013-12-10 ENCOUNTER — Other Ambulatory Visit: Payer: Self-pay | Admitting: Family Medicine

## 2014-03-04 ENCOUNTER — Other Ambulatory Visit: Payer: Self-pay

## 2014-03-04 DIAGNOSIS — Z1231 Encounter for screening mammogram for malignant neoplasm of breast: Secondary | ICD-10-CM

## 2014-05-22 ENCOUNTER — Other Ambulatory Visit: Payer: Self-pay | Admitting: Family Medicine

## 2014-05-24 ENCOUNTER — Ambulatory Visit
Admission: RE | Admit: 2014-05-24 | Discharge: 2014-05-24 | Disposition: A | Discharge status: Home / Self Care | Payer: 59 | Source: Ambulatory Visit

## 2014-05-24 ENCOUNTER — Encounter (INDEPENDENT_AMBULATORY_CARE_PROVIDER_SITE_OTHER): Payer: Self-pay

## 2014-05-24 DIAGNOSIS — Z1231 Encounter for screening mammogram for malignant neoplasm of breast: Secondary | ICD-10-CM

## 2014-08-24 ENCOUNTER — Other Ambulatory Visit: Payer: Self-pay | Admitting: Family Medicine

## 2014-09-30 ENCOUNTER — Ambulatory Visit (INDEPENDENT_AMBULATORY_CARE_PROVIDER_SITE_OTHER): Payer: 59 | Admitting: Family Medicine

## 2014-09-30 ENCOUNTER — Encounter: Payer: Self-pay | Admitting: Family Medicine

## 2014-09-30 VITALS — BP 112/68 | HR 59 | Temp 98.7°F | Ht 67.5 in | Wt 211.2 lb

## 2014-09-30 DIAGNOSIS — E038 Other specified hypothyroidism: Secondary | ICD-10-CM

## 2014-09-30 DIAGNOSIS — K219 Gastro-esophageal reflux disease without esophagitis: Secondary | ICD-10-CM

## 2014-09-30 DIAGNOSIS — E785 Hyperlipidemia, unspecified: Secondary | ICD-10-CM | POA: Diagnosis not present

## 2014-09-30 DIAGNOSIS — Z23 Encounter for immunization: Secondary | ICD-10-CM

## 2014-09-30 DIAGNOSIS — I10 Essential (primary) hypertension: Secondary | ICD-10-CM | POA: Diagnosis not present

## 2014-09-30 DIAGNOSIS — Z0000 Encounter for general adult medical examination without abnormal findings: Secondary | ICD-10-CM | POA: Diagnosis not present

## 2014-09-30 DIAGNOSIS — Z1211 Encounter for screening for malignant neoplasm of colon: Secondary | ICD-10-CM

## 2014-09-30 LAB — COMPREHENSIVE METABOLIC PANEL
ALK PHOS: 71 U/L (ref 39–117)
ALT: 34 U/L (ref 0–35)
AST: 26 U/L (ref 0–37)
Albumin: 4.8 g/dL (ref 3.5–5.2)
BILIRUBIN TOTAL: 0.8 mg/dL (ref 0.2–1.2)
BUN: 10 mg/dL (ref 6–23)
CALCIUM: 9.8 mg/dL (ref 8.4–10.5)
CO2: 31 mEq/L (ref 19–32)
Chloride: 104 mEq/L (ref 96–112)
Creatinine, Ser: 0.9 mg/dL (ref 0.40–1.20)
GFR: 68.41 mL/min (ref >60.00)
Glucose, Bld: 99 mg/dL (ref 70–99)
Potassium: 3.9 mEq/L (ref 3.5–5.1)
Sodium: 143 mEq/L (ref 135–145)
TOTAL PROTEIN: 7.1 g/dL (ref 6.0–8.3)

## 2014-09-30 LAB — LIPID PANEL
CHOLESTEROL: 197 mg/dL (ref 0–200)
HDL: 50.1 mg/dL (ref >39.00)
LDL CALC: 118 mg/dL — AB (ref 0–99)
NonHDL: 147.16
TRIGLYCERIDES: 146 mg/dL (ref 0.0–149.0)
Total CHOL/HDL Ratio: 4
VLDL: 29.2 mg/dL (ref 0.0–40.0)

## 2014-09-30 LAB — CBC WITH DIFFERENTIAL/PLATELET
BASOS ABS: 0 10*3/uL (ref 0.0–0.1)
BASOS PCT: 0.5 % (ref 0.0–3.0)
Eosinophils Absolute: 0.3 10*3/uL (ref 0.0–0.7)
Eosinophils Relative: 4.2 % (ref 0.0–5.0)
HEMATOCRIT: 46.2 % — AB (ref 36.0–46.0)
Hemoglobin: 15.6 g/dL — ABNORMAL HIGH (ref 12.0–15.0)
LYMPHS PCT: 26.8 % (ref 12.0–46.0)
Lymphs Abs: 1.7 10*3/uL (ref 0.7–4.0)
MCHC: 33.8 g/dL (ref 30.0–36.0)
MCV: 93 fl (ref 78.0–100.0)
MONOS PCT: 10.4 % (ref 3.0–12.0)
Monocytes Absolute: 0.6 10*3/uL (ref 0.1–1.0)
NEUTROS ABS: 3.6 10*3/uL (ref 1.4–7.7)
Neutrophils Relative %: 58.1 % (ref 43.0–77.0)
Platelets: 176 10*3/uL (ref 150.0–400.0)
RBC: 4.97 Mil/uL (ref 3.87–5.11)
RDW: 12.2 % (ref 11.5–15.5)
WBC: 6.2 10*3/uL (ref 4.0–10.5)

## 2014-09-30 LAB — T4, FREE: Free T4: 0.84 ng/dL (ref 0.60–1.60)

## 2014-09-30 LAB — TSH: TSH: 4.42 u[IU]/mL (ref 0.35–4.50)

## 2014-09-30 MED ORDER — LEVOTHYROXINE SODIUM 75 MCG PO TABS: ORAL_TABLET | ORAL | Status: DC

## 2014-09-30 MED ORDER — SIMVASTATIN 20 MG PO TABS: 20.0000 mg | ORAL_TABLET | Freq: Every day | ORAL | Status: DC

## 2014-09-30 MED ORDER — OMEPRAZOLE 40 MG PO CPDR: 40.0000 mg | DELAYED_RELEASE_CAPSULE | Freq: Every day | ORAL | Status: DC

## 2014-09-30 MED ORDER — AMLODIPINE BESYLATE 10 MG PO TABS: ORAL_TABLET | ORAL | Status: DC

## 2014-09-30 NOTE — Patient Instructions (Signed)
Great to see you.  Check with your insurance to see if they will cover the shingles shot.  We will call you with your lab results.

## 2014-09-30 NOTE — Assessment & Plan Note (Signed)
Continue current rx. Check labs today. 

## 2014-09-30 NOTE — Progress Notes (Signed)
Pre visit review using our clinic review tool, if applicable. No additional management support is needed unless otherwise documented below in the visit note. 

## 2014-09-30 NOTE — Progress Notes (Signed)
59 yo pleasant female here for CPX and follow up of chronic medical conditions.  Has GYN- sees Dr. Pamala Hurry, next Friday.  Had a colonoscopy a few years ago (before she turned 88)- sees Dr. Amedeo Plenty.  Was told she did not have to have another one for 10 years.  She denies any blood in her stool or changes in her bowel habits.  Bowels are very regular. IFOB neg on 10/05/13 Mammogram 04/2014.  HTN- has been stable on Amlodipine 10 mg daily for over 10 years.   Lab Results  Component Value Date   CREATININE 1.0 09/24/2013     Hypothyroidism- has been stable on current dose of synthroid for several years.  Denies any symptoms of hypo or hyperthyroidism.  Overdue for labs. Lab Results  Component Value Date   TSH 1.12 09/24/2013     HLD- on zocor 20 mg daily- overdue for labs.  Denies myalgias. Lab Results  Component Value Date   CHOL 181 09/24/2013   HDL 45.90 09/24/2013   LDLCALC 109* 09/24/2013   LDLDIRECT 188.5 09/17/2012   TRIG 132.0 09/24/2013   CHOLHDL 4 09/24/2013   Lab Results  Component Value Date   ALT 31 09/24/2013   AST 26 09/24/2013   ALKPHOS 67 09/24/2013   BILITOT 0.8 09/24/2013   Lab Results  Component Value Date   WBC 6.7 09/24/2013   HGB 15.9* 09/24/2013   HCT 46.4* 09/24/2013   MCV 91.8 09/24/2013   PLT 187.0 09/24/2013    Current Outpatient Prescriptions on File Prior to Visit  Medication Sig Dispense Refill  . amLODipine (NORVASC) 10 MG tablet TAKE 1 TABLET DAILY (OFFICE VISIT REQUIRED FOR ADDITIONAL REFILLS) 30 tablet 0  . Azelaic Acid (FINACEA EX) Use as directed     . Calcium-Vitamin D-Vitamin K (CALCIUM SOFT CHEWS) 500-100-40 MG-UNT-MCG CHEW Chew 1 tablet by mouth daily.      Marland Kitchen levothyroxine (SYNTHROID, LEVOTHROID) 75 MCG tablet TAKE 1 TABLET DAILY  (OFFICE VISIT REQUIRED FOR ADDITIONAL REFILLS) 30 tablet 0  . Multiple Vitamin (MULTIVITAMINS PO) Take 1 tablet by mouth daily.      Marland Kitchen omeprazole (PRILOSEC) 40 MG capsule TAKE 1 CAPSULE DAILY 90  capsule 3  . simvastatin (ZOCOR) 20 MG tablet TAKE 1 TABLET AT BEDTIME 90 tablet 1   No current facility-administered medications on file prior to visit.    Allergies  Allergen Reactions  . Sulfa Antibiotics Hives and Swelling  . Lisinopril     Bradycardia     Past Medical History  Diagnosis Date  . GERD (gastroesophageal reflux disease)   . Hyperlipidemia   . Hypertension   . Hypothyroidism     Past Surgical History  Procedure Laterality Date  . Pilonidal cyst removal  1980s  . Tonsillectomy    . Ovarian cyst removal      History reviewed. No pertinent family history.  Social History   Social History  . Marital Status: Single    Spouse Name: N/A  . Number of Children: 0  . 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 Smoking status: Never Smoker   . Smokeless tobacco: Not on file  . Alcohol Use: No  . Drug Use: No  . Sexual Activity: Not on file   Other Topics Concern  . Not on file   Social History Narrative   The PMH, PSH, Social History, Family History, Medications, and allergies have been reviewed  in [facility], and have been updated if relevant.  ROS:  Review of Systems  Constitutional: Negative.   HENT: Negative.   Respiratory: Negative.   Cardiovascular: Negative.   Gastrointestinal: Negative.   Endocrine: Negative.   Genitourinary: Negative.   Musculoskeletal: Negative.   Skin: Negative.   Allergic/Immunologic: Negative.   Neurological: Negative.   Hematological: Negative.   Psychiatric/Behavioral: Negative.   All other systems reviewed and are negative.    Physical exam BP 112/68 mmHg  Pulse 59  Temp(Src) 98.7 F (37.1 C) (Oral)  Ht 5' 7.5" (1.715 m)  Wt 211 lb 4 oz (95.822 kg)  BMI 32.58 kg/m2  SpO2 97% Wt Readings from Last 3 Encounters:  09/30/14 211 lb 4 oz (95.822 kg)  09/24/13 216 lb 8 oz (98.204 kg)  09/17/12 209 lb (94.802 kg)   General:   Well-developed,well-nourished,in no acute distress; alert,appropriate and cooperative throughout examination Head:  normocephalic and atraumatic.   Eyes:  vision grossly intact, pupils equal, pupils round, and pupils reactive to light.   Ears:  R ear normal and L ear normal.   Nose:  no external deformity.   Mouth:  good dentition.   Lungs:  Normal respiratory effort, chest expands symmetrically. Lungs are clear to auscultation, no crackles or wheezes. Heart:  Normal rate and regular rhythm. S1 and S2 normal without gallop, murmur, click, rub or other extra sounds. Abdomen:  Bowel sounds positive,abdomen soft and non-tender without masses, organomegaly or hernias noted. Msk:  No deformity or scoliosis noted of thoracic or lumbar spine.   Extremities:  No clubbing, cyanosis, edema, or deformity noted with normal full range of motion of all joints.   Neurologic:  alert & oriented X3 and gait normal.   Skin:  Intact without suspicious lesions or rashes Cervical Nodes:  No lymphadenopathy noted Axillary Nodes:  No palpable lymphadenopathy Psych:  Cognition and judgment appear intact. Alert and cooperative with normal attention span and concentration. No apparent delusions, illusions, hallucinations

## 2014-09-30 NOTE — Assessment & Plan Note (Addendum)
Reviewed preventive care protocols, scheduled due services, and updated immunizations Discussed nutrition, exercise, diet, and healthy lifestyle.  Influenza vaccine given today.  Orders Placed This Encounter  Procedures  . Fecal occult blood, imunochemical  . Flu Vaccine QUAD 36+ mos PF IM (Fluarix & Fluzone Quad PF)  . CBC with Differential/Platelet  . Comprehensive metabolic panel  . TSH  . Lipid panel  . Hepatitis C Antibody  . T4, Free

## 2014-09-30 NOTE — Assessment & Plan Note (Signed)
Normotensive. No changes made to rxs today. 

## 2014-10-01 ENCOUNTER — Encounter: Payer: Self-pay | Admitting: *Deleted

## 2014-10-01 LAB — HEPATITIS C ANTIBODY: HCV AB: NEGATIVE

## 2014-10-15 ENCOUNTER — Encounter: Payer: Self-pay | Admitting: Obstetrics

## 2014-10-15 LAB — HM PAP SMEAR: HM PAP: NEGATIVE

## 2015-01-31 ENCOUNTER — Other Ambulatory Visit: Payer: Self-pay

## 2015-01-31 DIAGNOSIS — Z1231 Encounter for screening mammogram for malignant neoplasm of breast: Secondary | ICD-10-CM

## 2015-05-30 ENCOUNTER — Ambulatory Visit
Admission: RE | Admit: 2015-05-30 | Discharge: 2015-05-30 | Disposition: A | Discharge status: Home / Self Care | Payer: 59 | Source: Ambulatory Visit

## 2015-05-30 DIAGNOSIS — Z1231 Encounter for screening mammogram for malignant neoplasm of breast: Secondary | ICD-10-CM

## 2015-08-15 ENCOUNTER — Other Ambulatory Visit: Payer: Self-pay | Admitting: Family Medicine

## 2015-08-16 ENCOUNTER — Other Ambulatory Visit: Payer: Self-pay | Admitting: *Deleted

## 2015-08-16 ENCOUNTER — Other Ambulatory Visit: Payer: Self-pay

## 2015-08-16 MED ORDER — LEVOTHYROXINE SODIUM 75 MCG PO TABS: ORAL_TABLET | ORAL | 0 refills | Status: DC

## 2015-08-16 NOTE — Telephone Encounter (Signed)
Pt left v/m requesting 10 day refill to [facility] until can get mail order med. Per DPR left vm that refill was done as requested.

## 2015-08-17 NOTE — Telephone Encounter (Signed)
Pt checking on status of levothyroxine sent 08/16/15. Pt will ck with pharmacy.

## 2015-09-02 ENCOUNTER — Other Ambulatory Visit: Payer: Self-pay | Admitting: Family Medicine

## 2015-10-03 ENCOUNTER — Ambulatory Visit (INDEPENDENT_AMBULATORY_CARE_PROVIDER_SITE_OTHER): Payer: 59 | Admitting: Family Medicine

## 2015-10-03 ENCOUNTER — Encounter: Payer: 59 | Admitting: Family Medicine

## 2015-10-03 ENCOUNTER — Encounter: Payer: Self-pay | Admitting: Family Medicine

## 2015-10-03 VITALS — BP 106/62 | HR 59 | Temp 98.3°F | Ht 67.5 in | Wt 206.2 lb

## 2015-10-03 DIAGNOSIS — E785 Hyperlipidemia, unspecified: Secondary | ICD-10-CM | POA: Diagnosis not present

## 2015-10-03 DIAGNOSIS — Z1211 Encounter for screening for malignant neoplasm of colon: Secondary | ICD-10-CM

## 2015-10-03 DIAGNOSIS — Z0000 Encounter for general adult medical examination without abnormal findings: Secondary | ICD-10-CM

## 2015-10-03 DIAGNOSIS — E038 Other specified hypothyroidism: Secondary | ICD-10-CM

## 2015-10-03 DIAGNOSIS — Z23 Encounter for immunization: Secondary | ICD-10-CM

## 2015-10-03 DIAGNOSIS — K219 Gastro-esophageal reflux disease without esophagitis: Secondary | ICD-10-CM

## 2015-10-03 DIAGNOSIS — I10 Essential (primary) hypertension: Secondary | ICD-10-CM

## 2015-10-03 LAB — CBC WITH DIFFERENTIAL/PLATELET
BASOS PCT: 0.5 % (ref 0.0–3.0)
Basophils Absolute: 0 10*3/uL (ref 0.0–0.1)
EOS ABS: 0.2 10*3/uL (ref 0.0–0.7)
EOS PCT: 3.1 % (ref 0.0–5.0)
HCT: 46.3 % — ABNORMAL HIGH (ref 36.0–46.0)
Hemoglobin: 16 g/dL — ABNORMAL HIGH (ref 12.0–15.0)
LYMPHS ABS: 1.5 10*3/uL (ref 0.7–4.0)
Lymphocytes Relative: 20.2 % (ref 12.0–46.0)
MCHC: 34.7 g/dL (ref 30.0–36.0)
MCV: 90.5 fl (ref 78.0–100.0)
MONO ABS: 0.8 10*3/uL (ref 0.1–1.0)
Monocytes Relative: 10.4 % (ref 3.0–12.0)
NEUTROS ABS: 4.8 10*3/uL (ref 1.4–7.7)
Neutrophils Relative %: 65.8 % (ref 43.0–77.0)
PLATELETS: 183 10*3/uL (ref 150.0–400.0)
RBC: 5.11 Mil/uL (ref 3.87–5.11)
RDW: 12.2 % (ref 11.5–15.5)
WBC: 7.4 10*3/uL (ref 4.0–10.5)

## 2015-10-03 LAB — COMPREHENSIVE METABOLIC PANEL
ALBUMIN: 4.8 g/dL (ref 3.5–5.2)
ALK PHOS: 68 U/L (ref 39–117)
ALT: 32 U/L (ref 0–35)
AST: 30 U/L (ref 0–37)
BUN: 12 mg/dL (ref 6–23)
CHLORIDE: 104 meq/L (ref 96–112)
CO2: 29 mEq/L (ref 19–32)
Calcium: 9.6 mg/dL (ref 8.4–10.5)
Creatinine, Ser: 0.82 mg/dL (ref 0.40–1.20)
GFR: 75.9 mL/min (ref >60.00)
Glucose, Bld: 103 mg/dL — ABNORMAL HIGH (ref 70–99)
Potassium: 3.8 mEq/L (ref 3.5–5.1)
SODIUM: 141 meq/L (ref 135–145)
TOTAL PROTEIN: 7.3 g/dL (ref 6.0–8.3)
Total Bilirubin: 0.9 mg/dL (ref 0.2–1.2)

## 2015-10-03 LAB — LIPID PANEL
CHOLESTEROL: 208 mg/dL — AB (ref 0–200)
HDL: 46.7 mg/dL (ref >39.00)
LDL CALC: 128 mg/dL — AB (ref 0–99)
NonHDL: 160.96
TRIGLYCERIDES: 164 mg/dL — AB (ref 0.0–149.0)
Total CHOL/HDL Ratio: 4
VLDL: 32.8 mg/dL (ref 0.0–40.0)

## 2015-10-03 LAB — T4, FREE: Free T4: 0.94 ng/dL (ref 0.60–1.60)

## 2015-10-03 LAB — TSH: TSH: 3.35 u[IU]/mL (ref 0.35–4.50)

## 2015-10-03 NOTE — Assessment & Plan Note (Signed)
Clinically euthyroid. Due for labs today. No changes made to rxs.

## 2015-10-03 NOTE — Progress Notes (Signed)
Pre visit review using our clinic review tool, if applicable. No additional management support is needed unless otherwise documented below in the visit note. 

## 2015-10-03 NOTE — Assessment & Plan Note (Signed)
Reviewed preventive care protocols, scheduled due services, and updated immunizations Discussed nutrition, exercise, diet, and healthy lifestyle.  Labs, IFOB and influenza vaccine today. Orders Placed This Encounter  Procedures  . Fecal occult blood, imunochemical  . Flu Vaccine QUAD 36+ mos PF IM (Fluarix & Fluzone Quad PF)  . TSH  . T4, Free  . Comprehensive metabolic panel  . Lipid panel  . CBC with Differential/Platelet

## 2015-10-03 NOTE — Assessment & Plan Note (Signed)
Continue current rx. Check labs today. 

## 2015-10-03 NOTE — Progress Notes (Signed)
60 yo pleasant female here for CPX and follow up of chronic medical conditions.  Has GYN- sees Dr. Pamala Hurry, has appt scheduled for 9/25  Had a colonoscopy a few years ago (before she turned 59)- sees Dr. Amedeo Plenty.  Was told she did not have to have another one for 10 years.  She denies any blood in her stool or changes in her bowel habits.  Bowels are very regular. IFOB neg on 10/05/13 Mammogram 05/30/15  HTN- has been stable on Amlodipine 10 mg daily for over 10 years.   Lab Results  Component Value Date   CREATININE 0.90 09/30/2014     Hypothyroidism- has been stable on current dose of synthroid for several years.  Denies any symptoms of hypo or hyperthyroidism.  Overdue for labs. Lab Results  Component Value Date   TSH 4.42 09/30/2014     HLD- on zocor 20 mg daily- overdue for labs.  Denies myalgias. Lab Results  Component Value Date   CHOL 197 09/30/2014   HDL 50.10 09/30/2014   LDLCALC 118 (H) 09/30/2014   LDLDIRECT 188.5 09/17/2012   TRIG 146.0 09/30/2014   CHOLHDL 4 09/30/2014   Lab Results  Component Value Date   ALT 34 09/30/2014   AST 26 09/30/2014   ALKPHOS 71 09/30/2014   BILITOT 0.8 09/30/2014   Lab Results  Component Value Date   WBC 6.2 09/30/2014   HGB 15.6 (H) 09/30/2014   HCT 46.2 (H) 09/30/2014   MCV 93.0 09/30/2014   PLT 176.0 09/30/2014    Current Outpatient Prescriptions on File Prior to Visit  Medication Sig Dispense Refill  . amLODipine (NORVASC) 10 MG tablet Take 1 tablet (10 mg total) by mouth daily. TAKE 1 TABLET DAILY. COMPLETE PHYSICAL EXAM REQUIRED FOR ADDITIONAL REFILLS 90 tablet 0  . Azelaic Acid (FINACEA EX) Use as directed     . Calcium-Vitamin D-Vitamin K (CALCIUM SOFT CHEWS) 500-100-40 MG-UNT-MCG CHEW Chew 2 tablets by mouth daily.     Marland Kitchen levothyroxine (SYNTHROID, LEVOTHROID) 75 MCG tablet TAKE 1 TABLET DAILY  (OFFICE VISIT REQUIRED FOR ADDITIONAL REFILLS) 10 tablet 0  . Multiple Vitamin (MULTIVITAMINS PO) Take 1 tablet by mouth  daily.      Marland Kitchen omeprazole (PRILOSEC) 40 MG capsule Take 1 capsule (40 mg total) by mouth daily. 90 capsule 3  . simvastatin (ZOCOR) 20 MG tablet TAKE 1 TABLET AT BEDTIME 90 tablet 2   No current facility-administered medications on file prior to visit.     Allergies  Allergen Reactions  . Sulfa Antibiotics Hives and Swelling  . Lisinopril     Bradycardia     Past Medical History:  Diagnosis Date  . GERD (gastroesophageal reflux disease)   . Hyperlipidemia   . Hypertension   . Hypothyroidism     Past Surgical History:  Procedure Laterality Date  . OVARIAN CYST REMOVAL    . pilonidal cyst removal  1980s  . TONSILLECTOMY      No family history on file.  Social History   Social History  . Marital status: Single    Spouse name: N/A  . Number of children: 0  . 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 Smoking status: Never Smoker  . Smokeless tobacco: Not on file  . Alcohol use No  . Drug use: No  . Sexual activity: Not on file   Other Topics Concern  . Not on file  Social History Narrative  . No narrative on file   The PMH, PSH, Social History, Family History, Medications, and allergies have been reviewed in [facility], and have been updated if relevant.  ROS:  Review of Systems  Constitutional: Negative.   HENT: Negative.   Respiratory: Negative.   Cardiovascular: Negative.   Gastrointestinal: Negative.   Endocrine: Negative.   Genitourinary: Negative.   Musculoskeletal: Negative.   Skin: Negative.   Allergic/Immunologic: Negative.   Neurological: Negative.   Hematological: Negative.   Psychiatric/Behavioral: Negative.   All other systems reviewed and are negative.    Physical exam BP 106/62   Pulse (!) 59   Temp 98.3 F (36.8 C) (Oral)   Ht 5' 7.5" (1.715 m)   Wt 206 lb 4 oz (93.6 kg)   SpO2 97%   BMI 31.83 kg/m  Wt Readings from Last 3 Encounters:  10/03/15 206 lb 4  oz (93.6 kg)  09/30/14 211 lb 4 oz (95.8 kg)  09/24/13 216 lb 8 oz (98.2 kg)   General:  Well-developed,well-nourished,in no acute distress; alert,appropriate and cooperative throughout examination Head:  normocephalic and atraumatic.   Eyes:  vision grossly intact, pupils equal, pupils round, and pupils reactive to light.   Ears:  R ear normal and L ear normal.   Nose:  no external deformity.   Mouth:  good dentition.   Lungs:  Normal respiratory effort, chest expands symmetrically. Lungs are clear to auscultation, no crackles or wheezes. Heart:  Normal rate and regular rhythm. S1 and S2 normal without gallop, murmur, click, rub or other extra sounds. Abdomen:  Bowel sounds positive,abdomen soft and non-tender without masses, organomegaly or hernias noted. Msk:  No deformity or scoliosis noted of thoracic or lumbar spine.   Extremities:  No clubbing, cyanosis, edema, or deformity noted with normal full range of motion of all joints.   Neurologic:  alert & oriented X3 and gait normal.   Skin:  Intact without suspicious lesions or rashes Cervical Nodes:  No lymphadenopathy noted Axillary Nodes:  No palpable lymphadenopathy Psych:  Cognition and judgment appear intact. Alert and cooperative with normal attention span and concentration. No apparent delusions, illusions, hallucinations

## 2015-10-03 NOTE — Patient Instructions (Addendum)
Great to see you. We will call you with your lab results and you can view them online.  Please call your insurance company about cologuard coverage.

## 2015-10-03 NOTE — Assessment & Plan Note (Signed)
Well controlled. No changes made to rxs. 

## 2015-10-04 ENCOUNTER — Encounter: Payer: Self-pay | Admitting: *Deleted

## 2015-10-04 NOTE — Addendum Note (Signed)
Addended by: Ellamae Sia on: 10/04/2015 12:58 PM   Modules accepted: Orders

## 2015-10-27 ENCOUNTER — Other Ambulatory Visit (INDEPENDENT_AMBULATORY_CARE_PROVIDER_SITE_OTHER): Payer: 59

## 2015-10-27 DIAGNOSIS — Z1211 Encounter for screening for malignant neoplasm of colon: Secondary | ICD-10-CM | POA: Diagnosis not present

## 2015-10-27 LAB — FECAL OCCULT BLOOD, IMMUNOCHEMICAL: FECAL OCCULT BLD: NEGATIVE

## 2015-10-28 ENCOUNTER — Encounter: Payer: Self-pay | Admitting: *Deleted

## 2015-11-11 ENCOUNTER — Other Ambulatory Visit: Payer: Self-pay | Admitting: *Deleted

## 2015-11-11 MED ORDER — OMEPRAZOLE 40 MG PO CPDR: 40.0000 mg | DELAYED_RELEASE_CAPSULE | Freq: Every day | ORAL | 3 refills | Status: DC

## 2015-12-05 ENCOUNTER — Other Ambulatory Visit: Payer: Self-pay | Admitting: Family Medicine

## 2016-01-19 ENCOUNTER — Other Ambulatory Visit: Payer: Self-pay | Admitting: Family Medicine

## 2016-01-19 NOTE — Telephone Encounter (Signed)
Is pt needing to restart medication. Last Rx per chart 07/2015

## 2016-02-10 ENCOUNTER — Telehealth: Payer: Self-pay

## 2016-02-10 MED ORDER — AMLODIPINE BESYLATE 10 MG PO TABS: 10.0000 mg | ORAL_TABLET | Freq: Every day | ORAL | 2 refills | Status: DC

## 2016-02-10 MED ORDER — LEVOTHYROXINE SODIUM 75 MCG PO TABS: ORAL_TABLET | ORAL | 2 refills | Status: DC

## 2016-02-10 NOTE — Telephone Encounter (Signed)
Pt left v/m requesting refill of BP med and thyroid med to [facility]. Last annual 10/03/15 but note that pt had stopped thyroid med and restarted 12/2015. Left v/m requesting pt to cb; did pt stop taking thyroid med?

## 2016-02-10 NOTE — Telephone Encounter (Signed)
Pt called back and levothyroxine was not stopped;pt has continued as ordered and not stopped at any point. Pt last seen annual 10/03/15. Refilled per protocol and pt will ck with [facility].

## 2016-02-23 ENCOUNTER — Telehealth: Payer: Self-pay

## 2016-03-27 ENCOUNTER — Other Ambulatory Visit: Payer: Self-pay | Admitting: Obstetrics

## 2016-03-27 DIAGNOSIS — Z1231 Encounter for screening mammogram for malignant neoplasm of breast: Secondary | ICD-10-CM

## 2016-04-18 ENCOUNTER — Other Ambulatory Visit: Payer: Self-pay | Admitting: Family Medicine

## 2016-04-18 NOTE — Telephone Encounter (Signed)
Refilled/send to pharmacy 

## 2016-05-31 ENCOUNTER — Ambulatory Visit
Admission: RE | Admit: 2016-05-31 | Discharge: 2016-05-31 | Disposition: A | Discharge status: Home / Self Care | Payer: 59 | Source: Ambulatory Visit | Attending: Obstetrics | Admitting: Obstetrics

## 2016-05-31 ENCOUNTER — Other Ambulatory Visit: Payer: Self-pay | Admitting: Obstetrics

## 2016-05-31 DIAGNOSIS — N6489 Other specified disorders of breast: Secondary | ICD-10-CM | POA: Diagnosis not present

## 2016-05-31 DIAGNOSIS — N63 Unspecified lump in unspecified breast: Secondary | ICD-10-CM

## 2016-05-31 DIAGNOSIS — R928 Other abnormal and inconclusive findings on diagnostic imaging of breast: Secondary | ICD-10-CM | POA: Diagnosis not present

## 2016-05-31 DIAGNOSIS — Z1231 Encounter for screening mammogram for malignant neoplasm of breast: Secondary | ICD-10-CM

## 2016-07-16 DIAGNOSIS — D225 Melanocytic nevi of trunk: Secondary | ICD-10-CM | POA: Diagnosis not present

## 2016-07-16 DIAGNOSIS — I8311 Varicose veins of right lower extremity with inflammation: Secondary | ICD-10-CM | POA: Diagnosis not present

## 2016-07-16 DIAGNOSIS — I8312 Varicose veins of left lower extremity with inflammation: Secondary | ICD-10-CM | POA: Diagnosis not present

## 2016-08-09 NOTE — Telephone Encounter (Signed)
Opened in error

## 2016-10-04 ENCOUNTER — Encounter: Payer: 59 | Admitting: Family Medicine

## 2016-10-15 ENCOUNTER — Encounter: Payer: Self-pay | Admitting: Family Medicine

## 2016-10-15 ENCOUNTER — Ambulatory Visit (INDEPENDENT_AMBULATORY_CARE_PROVIDER_SITE_OTHER): Payer: 59 | Admitting: Family Medicine

## 2016-10-15 VITALS — BP 130/76 | HR 61 | Temp 98.6°F | Ht 67.5 in | Wt 216.8 lb

## 2016-10-15 DIAGNOSIS — E785 Hyperlipidemia, unspecified: Secondary | ICD-10-CM

## 2016-10-15 DIAGNOSIS — K219 Gastro-esophageal reflux disease without esophagitis: Secondary | ICD-10-CM | POA: Diagnosis not present

## 2016-10-15 DIAGNOSIS — I10 Essential (primary) hypertension: Secondary | ICD-10-CM

## 2016-10-15 DIAGNOSIS — E038 Other specified hypothyroidism: Secondary | ICD-10-CM

## 2016-10-15 DIAGNOSIS — Z0000 Encounter for general adult medical examination without abnormal findings: Secondary | ICD-10-CM

## 2016-10-15 DIAGNOSIS — Z23 Encounter for immunization: Secondary | ICD-10-CM

## 2016-10-15 DIAGNOSIS — Z1211 Encounter for screening for malignant neoplasm of colon: Secondary | ICD-10-CM | POA: Diagnosis not present

## 2016-10-15 LAB — COMPREHENSIVE METABOLIC PANEL
ALT: 37 U/L — ABNORMAL HIGH (ref 0–35)
AST: 30 U/L (ref 0–37)
Albumin: 4.9 g/dL (ref 3.5–5.2)
Alkaline Phosphatase: 74 U/L (ref 39–117)
BUN: 13 mg/dL (ref 6–23)
CO2: 30 meq/L (ref 19–32)
Calcium: 9.9 mg/dL (ref 8.4–10.5)
Chloride: 105 mEq/L (ref 96–112)
Creatinine, Ser: 0.88 mg/dL (ref 0.40–1.20)
GFR: 69.71 mL/min (ref >60.00)
GLUCOSE: 125 mg/dL — AB (ref 70–99)
Potassium: 4.2 mEq/L (ref 3.5–5.1)
Sodium: 142 mEq/L (ref 135–145)
Total Bilirubin: 1 mg/dL (ref 0.2–1.2)
Total Protein: 7.4 g/dL (ref 6.0–8.3)

## 2016-10-15 LAB — T4, FREE: Free T4: 0.87 ng/dL (ref 0.60–1.60)

## 2016-10-15 LAB — CBC WITH DIFFERENTIAL/PLATELET
BASOS ABS: 0 10*3/uL (ref 0.0–0.1)
Basophils Relative: 0.7 % (ref 0.0–3.0)
EOS PCT: 3.7 % (ref 0.0–5.0)
Eosinophils Absolute: 0.2 10*3/uL (ref 0.0–0.7)
HCT: 46.6 % — ABNORMAL HIGH (ref 36.0–46.0)
Hemoglobin: 16.3 g/dL — ABNORMAL HIGH (ref 12.0–15.0)
LYMPHS ABS: 1.7 10*3/uL (ref 0.7–4.0)
Lymphocytes Relative: 27.1 % (ref 12.0–46.0)
MCHC: 34.9 g/dL (ref 30.0–36.0)
MCV: 92.1 fl (ref 78.0–100.0)
MONO ABS: 0.7 10*3/uL (ref 0.1–1.0)
Monocytes Relative: 11.7 % (ref 3.0–12.0)
NEUTROS PCT: 56.8 % (ref 43.0–77.0)
Neutro Abs: 3.6 10*3/uL (ref 1.4–7.7)
Platelets: 198 10*3/uL (ref 150.0–400.0)
RBC: 5.07 Mil/uL (ref 3.87–5.11)
RDW: 12 % (ref 11.5–15.5)
WBC: 6.4 10*3/uL (ref 4.0–10.5)

## 2016-10-15 LAB — LIPID PANEL
Cholesterol: 233 mg/dL — ABNORMAL HIGH (ref 0–200)
HDL: 40.8 mg/dL (ref >39.00)
NONHDL: 191.7
Total CHOL/HDL Ratio: 6
Triglycerides: 207 mg/dL — ABNORMAL HIGH (ref 0.0–149.0)
VLDL: 41.4 mg/dL — ABNORMAL HIGH (ref 0.0–40.0)

## 2016-10-15 LAB — TSH: TSH: 5.3 u[IU]/mL — AB (ref 0.35–4.50)

## 2016-10-15 LAB — LDL CHOLESTEROL, DIRECT: LDL DIRECT: 133 mg/dL

## 2016-10-15 NOTE — Addendum Note (Signed)
Addended by: Tammi Sou on: 10/15/2016 09:35 AM   Modules accepted: Orders

## 2016-10-15 NOTE — Progress Notes (Signed)
61 yo pleasant female here for CPX and follow up of chronic medical conditions.  Has GYN- sees Dr. Pamala Hurry, has appt in November.  Had a colonoscopy several years ago (before she turned 68)- sees Dr. Amedeo Plenty.  Was told she did not have to have another one for 10 years.  She denies any blood in her stool or changes in her bowel habits.  Bowels are very regular. IFOB neg on 10/27/15 Mammogram 05/31/16  HTN- has been stable on Amlodipine 10 mg daily for over 10 years.   Lab Results  Component Value Date   CREATININE 0.82 10/03/2015    Hypothyroidism- has been stable on current dose of synthroid for several years.  Denies any symptoms of hypo or hyperthyroidism.  Overdue for labs. Lab Results  Component Value Date   TSH 3.35 10/03/2015     HLD- on zocor 20 mg daily- overdue for labs.  Denies myalgias. Lab Results  Component Value Date   CHOL 208 (H) 10/03/2015   HDL 46.70 10/03/2015   LDLCALC 128 (H) 10/03/2015   LDLDIRECT 188.5 09/17/2012   TRIG 164.0 (H) 10/03/2015   CHOLHDL 4 10/03/2015   Lab Results  Component Value Date   ALT 32 10/03/2015   AST 30 10/03/2015   ALKPHOS 68 10/03/2015   BILITOT 0.9 10/03/2015   Lab Results  Component Value Date   WBC 7.4 10/03/2015   HGB 16.0 (H) 10/03/2015   HCT 46.3 (H) 10/03/2015   MCV 90.5 10/03/2015   PLT 183.0 10/03/2015    Current Outpatient Prescriptions on File Prior to Visit  Medication Sig Dispense Refill  . amLODipine (NORVASC) 10 MG tablet Take 1 tablet (10 mg total) by mouth daily. 90 tablet 2  . Calcium-Vitamin D-Vitamin K (CALCIUM SOFT CHEWS) 500-100-40 MG-UNT-MCG CHEW Chew 2 tablets by mouth daily.     Marland Kitchen levothyroxine (SYNTHROID, LEVOTHROID) 75 MCG tablet TAKE 1 TABLET DAILY (OFFICE VISIT REQUIRED FOR ADDITIONAL REFILLS) 90 tablet 0  . Multiple Vitamin (MULTIVITAMINS PO) Take 1 tablet by mouth daily.      Marland Kitchen omeprazole (PRILOSEC) 40 MG capsule Take 1 capsule (40 mg total) by mouth daily. (Patient taking differently:  Take 40 mg by mouth daily as needed. ) 90 capsule 3  . simvastatin (ZOCOR) 20 MG tablet TAKE 1 TABLET AT BEDTIME (Patient not taking: Reported on 10/15/2016) 90 tablet 2   No current facility-administered medications on file prior to visit.     Allergies  Allergen Reactions  . Sulfa Antibiotics Hives and Swelling  . Lisinopril     Bradycardia     Past Medical History:  Diagnosis Date  . GERD (gastroesophageal reflux disease)   . Hyperlipidemia   . Hypertension   . Hypothyroidism     Past Surgical History:  Procedure Laterality Date  . OVARIAN CYST REMOVAL    . pilonidal cyst removal  1980s  . TONSILLECTOMY      No family history on file.  Social History   Social History  . Marital status: Single    Spouse name: N/A  . Number of children: 0  . 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 Smoking status: Never Smoker  . Smokeless tobacco: Never Used  . Alcohol use No  . Drug use: No  . Sexual activity: Not on file   Other Topics Concern  . Not on file   Social History Narrative  . No narrative  on file   The PMH, PSH, Social History, Family History, Medications, and allergies have been reviewed in [facility], and have been updated if relevant.  ROS:  Review of Systems  Constitutional: Negative.   HENT: Negative.   Respiratory: Negative.   Cardiovascular: Negative.   Gastrointestinal: Negative.   Endocrine: Negative.   Genitourinary: Negative.   Musculoskeletal: Negative.   Skin: Negative.   Allergic/Immunologic: Negative.   Neurological: Negative.   Hematological: Negative.   Psychiatric/Behavioral: Negative.   All other systems reviewed and are negative.    Physical exam BP 130/76 (BP Location: Right Arm, Patient Position: Sitting, Cuff Size: Normal)   Pulse 61   Temp 98.6 F (37 C) (Oral)   Ht 5' 7.5" (1.715 m)   Wt 216 lb 12 oz (98.3 kg)   SpO2 96%   BMI 33.45 kg/m  Wt  Readings from Last 3 Encounters:  10/15/16 216 lb 12 oz (98.3 kg)  10/03/15 206 lb 4 oz (93.6 kg)  09/30/14 211 lb 4 oz (95.8 kg)    General:  Well-developed,well-nourished,in no acute distress; alert,appropriate and cooperative throughout examination Head:  normocephalic and atraumatic.   Eyes:  vision grossly intact, PERRL Ears:  R ear normal and L ear normal externally, TMs clear bilaterally Nose:  no external deformity.   Mouth:  good dentition.   Neck:  No deformities, masses, or tenderness noted. Breasts:  No mass, nodules, thickening, tenderness, bulging, retraction, inflamation, nipple discharge or skin changes noted.   Lungs:  Normal respiratory effort, chest expands symmetrically. Lungs are clear to auscultation, no crackles or wheezes. Heart:  Normal rate and regular rhythm. S1 and S2 normal without gallop, murmur, click, rub or other extra sounds. Abdomen:  Bowel sounds positive,abdomen soft and non-tender without masses, organomegaly or hernias noted. Msk:  No deformity or scoliosis noted of thoracic or lumbar spine.   Extremities:  No clubbing, cyanosis, edema, or deformity noted with normal full range of motion of all joints.   Neurologic:  alert & oriented X3 and gait normal.   Skin:  Intact without suspicious lesions or rashes Cervical Nodes:  No lymphadenopathy noted Axillary Nodes:  No palpable lymphadenopathy Psych:  Cognition and judgment appear intact. Alert and cooperative with normal attention span and concentration. No apparent delusions, illusions, hallucinations

## 2016-10-15 NOTE — Assessment & Plan Note (Signed)
Reviewed preventive care protocols, scheduled due services, and updated immunizations Discussed nutrition, exercise, diet, and healthy lifestyle.  Orders Placed This Encounter  Procedures  . Fecal occult blood, imunochemical  . CBC with Differential/Platelet  . Comprehensive metabolic panel  . Lipid panel  . TSH  . T4, Free

## 2016-10-15 NOTE — Addendum Note (Signed)
Addended by: Mady Haagensen on: 10/15/2016 09:47 AM   Modules accepted: Orders

## 2016-10-15 NOTE — Assessment & Plan Note (Signed)
Normotensive.  No changes made. 

## 2016-10-15 NOTE — Assessment & Plan Note (Signed)
Continue current dose of synthroid.  Check labs today. 

## 2016-10-15 NOTE — Assessment & Plan Note (Signed)
Continue current dose of statin. Check labs today. 

## 2016-10-16 DIAGNOSIS — H35361 Drusen (degenerative) of macula, right eye: Secondary | ICD-10-CM | POA: Diagnosis not present

## 2016-10-16 DIAGNOSIS — H2513 Age-related nuclear cataract, bilateral: Secondary | ICD-10-CM | POA: Diagnosis not present

## 2016-10-16 DIAGNOSIS — H25013 Cortical age-related cataract, bilateral: Secondary | ICD-10-CM | POA: Diagnosis not present

## 2016-11-05 ENCOUNTER — Other Ambulatory Visit: Payer: Self-pay | Admitting: Family Medicine

## 2016-12-17 DIAGNOSIS — Z01419 Encounter for gynecological examination (general) (routine) without abnormal findings: Secondary | ICD-10-CM | POA: Diagnosis not present

## 2016-12-21 ENCOUNTER — Ambulatory Visit (INDEPENDENT_AMBULATORY_CARE_PROVIDER_SITE_OTHER): Payer: Self-pay

## 2016-12-21 ENCOUNTER — Ambulatory Visit (INDEPENDENT_AMBULATORY_CARE_PROVIDER_SITE_OTHER): Payer: 59 | Admitting: Orthopaedic Surgery

## 2016-12-21 ENCOUNTER — Encounter (INDEPENDENT_AMBULATORY_CARE_PROVIDER_SITE_OTHER): Payer: Self-pay | Admitting: Orthopaedic Surgery

## 2016-12-21 VITALS — BP 178/90 | HR 73 | Resp 14 | Ht 67.0 in | Wt 216.0 lb

## 2016-12-21 DIAGNOSIS — G8929 Other chronic pain: Secondary | ICD-10-CM | POA: Diagnosis not present

## 2016-12-21 DIAGNOSIS — M25561 Pain in right knee: Secondary | ICD-10-CM | POA: Diagnosis not present

## 2016-12-21 NOTE — Progress Notes (Signed)
Office Visit Note   Patient: Elizabeth Fitzpatrick           Date of Birth: 1955-02-03           MRN: [id number] Visit Date: 12/21/2016              Requested by: Lucille Passy, MD [place], [place] [address] PCP: Lucille Passy, MD   Assessment & Plan: Visit Diagnoses:  1. Chronic pain of right knee     Plan: Films note localized arthritis in  the lateral compartment. I believe she has exacerbated the arthritis as a result of her. injuries. Discussed used of NSAIDs and exercise. If no improvement over the next several weeks would consider local cortisone injection.  Follow-Up Instructions: No Follow-up on file.   Orders:  Orders Placed This Encounter  Procedures  . XR KNEE 3 VIEW RIGHT   No orders of the defined types were placed in this encounter.     Procedures: No procedures performed   Clinical Data: No additional findings.   Subjective: Chief Complaint  Patient presents with  . Right Knee - Injury, Pain, Edema    Mrs. Briel is a 61 y o here for Right knee pain that started iin August as she fell in her bathroom. A second accident while going doing stairs at a theater. She felt a "tear" lateral R knee.  61 year old  has had an issue with her right knee since August. She had initial injury when she slipped on a wet floor in her bathroom striking the anterolateral aspect of her right knee against the top. After that she was having significant discomfort with bending stooping and squatting. She had a second injury in September when she felt something "happen" to sitting for a long time and walking up a set of stairs. The pain seems to be localized along the anterior lateral aspect of her knee. Sometimes she has some "swelling. She does have an occasional limp. She has a particular problem when she sits for a length of time with her knee flexed and then gets up. Prior to any of those injuries she's not had any problem is her tingling. She denies any problem  with drug or groin discomfort.  HPI  Review of Systems  Constitutional: Negative for chills, fatigue and fever.  Eyes: Negative for itching.  Respiratory: Negative for chest tightness and shortness of breath.   Cardiovascular: Negative for chest pain, palpitations and leg swelling.  Gastrointestinal: Negative for blood in stool, constipation and diarrhea.  Endocrine: Negative for polyuria.  Genitourinary: Negative for dysuria.  Musculoskeletal: Negative for back pain, joint swelling, neck pain and neck stiffness.  Allergic/Immunologic: Negative for immunocompromised state.  Neurological: Negative for dizziness and numbness.  Hematological: Does not bruise/bleed easily.  Psychiatric/Behavioral: The patient is not nervous/anxious.      Objective: Vital Signs: BP (!) 178/90   Pulse 73   Resp 14   Ht 5\' 7"  (1.702 m)   Wt 216 lb (98 kg)   BMI 33.83 kg/m   Physical Exam   Ortho Exam awake alert and oriented 3. Comfortable sitting. Very small effusion of her right knee. Some discomfort along the anterior lateral joint line. No pain medially. No popping or clicking. Full extension. Skin intact. No increased warmth. No popliteal fullness. No calf pain. Neurovascular exam intact. Straight leg raise negative.  Specialty Comments:  No specialty comments available.  Imaging: No results found.   PMFS History: Patient Active Problem  List   Diagnosis Date Noted  . Routine general medical examination at a health care facility 08/15/2011  . GERD (gastroesophageal reflux disease) 04/26/2010  . Hypertension 04/26/2010  . Hypothyroidism 04/26/2010  . Hyperlipidemia 04/26/2010   Past Medical History:  Diagnosis Date  . GERD (gastroesophageal reflux disease)   . Hyperlipidemia   . Hypertension   . Hypothyroidism     History reviewed. No pertinent family history.  Past Surgical History:  Procedure Laterality Date  . OVARIAN CYST REMOVAL    . pilonidal cyst removal  1980s  .  TONSILLECTOMY     Social History   Occupational History  . Occupation: Administrator, sports: [employer]  Tobacco Use  . Smoking status: Never Smoker  . Smokeless tobacco: Never Used  Substance and Sexual Activity  . Alcohol use: No    Alcohol/week: 0.0 oz  . Drug use: No  . Sexual activity: Not on file

## 2017-02-04 ENCOUNTER — Other Ambulatory Visit: Payer: Self-pay | Admitting: Family Medicine

## 2017-05-06 ENCOUNTER — Other Ambulatory Visit: Payer: Self-pay | Admitting: Family Medicine

## 2017-06-07 ENCOUNTER — Other Ambulatory Visit: Payer: Self-pay

## 2017-06-07 ENCOUNTER — Encounter: Payer: Self-pay | Admitting: Family Medicine

## 2017-06-07 ENCOUNTER — Ambulatory Visit (INDEPENDENT_AMBULATORY_CARE_PROVIDER_SITE_OTHER): Payer: 59 | Admitting: Family Medicine

## 2017-06-07 VITALS — BP 120/72 | HR 62 | Temp 98.8°F | Ht 67.5 in | Wt 215.0 lb

## 2017-06-07 DIAGNOSIS — M19011 Primary osteoarthritis, right shoulder: Secondary | ICD-10-CM | POA: Insufficient documentation

## 2017-06-07 NOTE — Assessment & Plan Note (Signed)
No red flags. Pt nonsmoker.  Non-tender bony prominence likely due to OA.  Follow over time.. If chaning consider CXR eval.

## 2017-06-07 NOTE — Patient Instructions (Addendum)
Cancel the pre-visit and keep the physical in October.  Area of seeing is likley bony prominence from arthritis.  No treatment needed, follow over time.  Let me know if you have pain , redness and change in size of area.

## 2017-06-07 NOTE — Progress Notes (Signed)
Subjective:    Patient ID: Elizabeth Fitzpatrick, female    DOB: 1955/11/23, 62 y.o.   MRN: [id number]  HPI   62 year old female pt of Dr. Deborra Medina ( plans transfer of care to me)  Presents with new onset swelling in anterior upper chest x several months, no change in size.  No soreness, no   Feels a slight catch when leaning arm forward.   Leans forward a lot at work. She works at a Engineer, production daily. Not sure if injured with these movements.  No falls.   no neck pain, no numbness, no tingling, no weakness in upper extremities.   Has OA in right knee.   Family history of thyroid disease and parathyroid disease in sister.  Review of Systems  Constitutional: Negative for fatigue and fever.  HENT: Negative for congestion.   Eyes: Negative for pain.  Respiratory: Negative for cough and shortness of breath.   Cardiovascular: Negative for chest pain, palpitations and leg swelling.  Gastrointestinal: Negative for abdominal pain.  Genitourinary: Negative for dysuria and vaginal bleeding.  Musculoskeletal: Negative for back pain.  Neurological: Negative for syncope, light-headedness and headaches.  Psychiatric/Behavioral: Negative for dysphoric mood.       Objective:   Physical Exam  Constitutional: Vital signs are normal. She appears well-developed and well-nourished. She is cooperative.  Non-toxic appearance. She does not appear ill. No distress.  HENT:  Head: Normocephalic.  Right Ear: Hearing, tympanic membrane, external ear and ear canal normal. Tympanic membrane is not erythematous, not retracted and not bulging.  Left Ear: Hearing, tympanic membrane, external ear and ear canal normal. Tympanic membrane is not erythematous, not retracted and not bulging.  Nose: No mucosal edema or rhinorrhea. Right sinus exhibits no maxillary sinus tenderness and no frontal sinus tenderness. Left sinus exhibits no maxillary sinus tenderness and no frontal sinus tenderness.  Mouth/Throat: Uvula is  midline, oropharynx is clear and moist and mucous membranes are normal.  Eyes: Pupils are equal, round, and reactive to light. Conjunctivae, EOM and lids are normal. Lids are everted and swept, no foreign bodies found.  Neck: Trachea normal and normal range of motion. Neck supple. Carotid bruit is not present. No thyroid mass and no thyromegaly present.  Cardiovascular: Normal rate, regular rhythm, S1 normal, S2 normal, normal heart sounds, intact distal pulses and normal pulses. Exam reveals no gallop and no friction rub.  No murmur heard. Pulmonary/Chest: Effort normal and breath sounds normal. No tachypnea. No respiratory distress. She has no decreased breath sounds. She has no wheezes. She has no rhonchi. She has no rales.  Abdominal: Soft. Normal appearance and bowel sounds are normal. There is no tenderness.  Musculoskeletal:       Right shoulder: Normal.       Cervical back: She exhibits deformity. She exhibits normal range of motion, no tenderness and no bony tenderness.  Alight khyphosis of neck.. Plan DEXA with GYN upcoming Bony prominence of right sternoclavicular joint  Lymphadenopathy:       Head (right side): No submental, no submandibular, no tonsillar, no preauricular, no posterior auricular and no occipital adenopathy present.       Head (left side): No submental, no submandibular, no tonsillar, no preauricular, no posterior auricular and no occipital adenopathy present.    She has no cervical adenopathy.       Right axillary: No pectoral adenopathy present.       Left axillary: No pectoral adenopathy present.  Right: No supraclavicular adenopathy present.       Left: No supraclavicular adenopathy present.  Neurological: She is alert.  Skin: Skin is warm, dry and intact. No rash noted.  Psychiatric: Her speech is normal and behavior is normal. Judgment and thought content normal. Her mood appears not anxious. Cognition and memory are normal. She does not exhibit a depressed  mood.          Assessment & Plan:

## 2017-07-22 ENCOUNTER — Other Ambulatory Visit: Payer: Self-pay | Admitting: *Deleted

## 2017-07-22 MED ORDER — AMLODIPINE BESYLATE 10 MG PO TABS: 10.0000 mg | ORAL_TABLET | Freq: Every day | ORAL | 0 refills | Status: DC

## 2017-10-17 DIAGNOSIS — H25013 Cortical age-related cataract, bilateral: Secondary | ICD-10-CM | POA: Diagnosis not present

## 2017-10-17 DIAGNOSIS — H35361 Drusen (degenerative) of macula, right eye: Secondary | ICD-10-CM | POA: Diagnosis not present

## 2017-10-17 DIAGNOSIS — H2513 Age-related nuclear cataract, bilateral: Secondary | ICD-10-CM | POA: Diagnosis not present

## 2017-10-17 LAB — HM DIABETES EYE EXAM

## 2017-10-21 ENCOUNTER — Other Ambulatory Visit: Payer: Self-pay | Admitting: Family Medicine

## 2017-11-01 ENCOUNTER — Ambulatory Visit: Payer: 59 | Admitting: Family Medicine

## 2017-11-04 ENCOUNTER — Other Ambulatory Visit: Payer: Self-pay | Admitting: *Deleted

## 2017-11-04 MED ORDER — LEVOTHYROXINE SODIUM 75 MCG PO TABS: 75.0000 ug | ORAL_TABLET | Freq: Every day | ORAL | 0 refills | Status: DC

## 2017-11-08 ENCOUNTER — Encounter (INDEPENDENT_AMBULATORY_CARE_PROVIDER_SITE_OTHER): Payer: Self-pay

## 2017-11-08 ENCOUNTER — Encounter: Payer: Self-pay | Admitting: Family Medicine

## 2017-11-08 ENCOUNTER — Ambulatory Visit (INDEPENDENT_AMBULATORY_CARE_PROVIDER_SITE_OTHER): Payer: 59 | Admitting: Family Medicine

## 2017-11-08 VITALS — BP 130/74 | HR 68 | Temp 98.8°F | Ht 67.5 in | Wt 215.5 lb

## 2017-11-08 DIAGNOSIS — E038 Other specified hypothyroidism: Secondary | ICD-10-CM | POA: Diagnosis not present

## 2017-11-08 DIAGNOSIS — Z0000 Encounter for general adult medical examination without abnormal findings: Secondary | ICD-10-CM

## 2017-11-08 DIAGNOSIS — Z23 Encounter for immunization: Secondary | ICD-10-CM

## 2017-11-08 DIAGNOSIS — I10 Essential (primary) hypertension: Secondary | ICD-10-CM | POA: Diagnosis not present

## 2017-11-08 DIAGNOSIS — Z1211 Encounter for screening for malignant neoplasm of colon: Secondary | ICD-10-CM

## 2017-11-08 DIAGNOSIS — E785 Hyperlipidemia, unspecified: Secondary | ICD-10-CM

## 2017-11-08 DIAGNOSIS — R351 Nocturia: Secondary | ICD-10-CM

## 2017-11-08 LAB — LIPID PANEL
CHOL/HDL RATIO: 5
Cholesterol: 233 mg/dL — ABNORMAL HIGH (ref 0–200)
HDL: 45.2 mg/dL (ref >39.00)
NONHDL: 187.64
Triglycerides: 230 mg/dL — ABNORMAL HIGH (ref 0.0–149.0)
VLDL: 46 mg/dL — ABNORMAL HIGH (ref 0.0–40.0)

## 2017-11-08 LAB — T4, FREE: FREE T4: 0.85 ng/dL (ref 0.60–1.60)

## 2017-11-08 LAB — T3, FREE: T3 FREE: 3.3 pg/mL (ref 2.3–4.2)

## 2017-11-08 LAB — TSH: TSH: 4.05 u[IU]/mL (ref 0.35–4.50)

## 2017-11-08 LAB — LDL CHOLESTEROL, DIRECT: Direct LDL: 151 mg/dL

## 2017-11-08 LAB — HEMOGLOBIN A1C: Hgb A1c MFr Bld: 7.8 % — ABNORMAL HIGH (ref 4.6–6.5)

## 2017-11-08 MED ORDER — AMLODIPINE BESYLATE 10 MG PO TABS: 10.0000 mg | ORAL_TABLET | Freq: Every day | ORAL | 3 refills | Status: DC

## 2017-11-08 NOTE — Patient Instructions (Addendum)
Please stop at the lab to have labs drawn. Decrease sweet tea.. Change to water or non-sweet decaf. Drink more earlier in day and avoid bladder irritants.  Get back to [place] 3-5 days a week.  Stop at lab on way out for stool cards.

## 2017-11-08 NOTE — Progress Notes (Signed)
Subjective:    Patient ID: Elizabeth Fitzpatrick, female    DOB: Oct 05, 1955, 62 y.o.   MRN: [id number]  HPI   The patient is here for annual wellness exam and preventative care.    Hypertension:   At goal on amlodipine 10 daily BP Readings from Last 3 Encounters:  11/08/17 130/74  06/07/17 120/72  12/21/16 (!) 178/90  Using medication without problems or lightheadedness: none Chest pain with exertion:none Edema:none Short of breath: none Average home BPs: Other issues:  Elevated Cholesterol: Due for re-eval  .. No longer taking simvastatin.Marland Kitchen No SE just didn't want to take it. Lab Results  Component Value Date   CHOL 233 (H) 10/15/2016   HDL 40.80 10/15/2016   LDLCALC 128 (H) 10/03/2015   LDLDIRECT 133.0 10/15/2016   TRIG 207.0 (H) 10/15/2016   CHOLHDL 6 10/15/2016  Using medications without problems: Muscle aches:  Diet compliance:moderate.. Sweet tea, desserts. 1-2 a day, veggies Exercise: none. Other complaints:  Hypothyroid  Due for re-eval on 75 mcg levo. Lab Results  Component Value Date   TSH 5.30 (H) 10/15/2016     Social History /Family History/Past Medical History reviewed in detail and updated in EMR if needed. Blood pressure 130/74, pulse 68, temperature 98.8 F (37.1 C), temperature source Oral, height 5' 7.5" (1.715 m), weight 215 lb 8 oz (97.8 kg).   Review of Systems  Constitutional: Negative for fatigue and fever.  HENT: Negative for congestion.   Eyes: Negative for pain.  Respiratory: Negative for cough and shortness of breath.   Cardiovascular: Negative for chest pain, palpitations and leg swelling.  Gastrointestinal: Negative for abdominal pain.  Genitourinary: Negative for dysuria and vaginal bleeding.  Musculoskeletal: Negative for back pain.  Neurological: Negative for syncope, light-headedness and headaches.  Psychiatric/Behavioral: Negative for dysphoric mood.       Objective:   Physical Exam  Constitutional: She is oriented to person,  place, and time. Vital signs are normal. She appears well-developed and well-nourished. She is cooperative.  Non-toxic appearance. She does not appear ill. No distress.  HENT:  Head: Normocephalic.  Right Ear: Hearing, tympanic membrane, external ear and ear canal normal. Tympanic membrane is not erythematous, not retracted and not bulging.  Left Ear: Hearing, tympanic membrane, external ear and ear canal normal. Tympanic membrane is not erythematous, not retracted and not bulging.  Nose: Nose normal. No mucosal edema or rhinorrhea. Right sinus exhibits no maxillary sinus tenderness and no frontal sinus tenderness. Left sinus exhibits no maxillary sinus tenderness and no frontal sinus tenderness.  Mouth/Throat: Uvula is midline, oropharynx is clear and moist and mucous membranes are normal.  Eyes: Pupils are equal, round, and reactive to light. Conjunctivae, EOM and lids are normal. Lids are everted and swept, no foreign bodies found.  Neck: Trachea normal and normal range of motion. Neck supple. Carotid bruit is not present. No thyroid mass and no thyromegaly present.  Cardiovascular: Normal rate, regular rhythm, S1 normal, S2 normal, normal heart sounds, intact distal pulses and normal pulses. Exam reveals no gallop and no friction rub.  No murmur heard. Pulmonary/Chest: Effort normal and breath sounds normal. No tachypnea. No respiratory distress. She has no decreased breath sounds. She has no wheezes. She has no rhonchi. She has no rales.  Abdominal: Soft. Normal appearance and bowel sounds are normal. She exhibits no distension, no fluid wave, no abdominal bruit and no mass. There is no hepatosplenomegaly. There is no tenderness. There is no rebound, no guarding and no CVA  tenderness. No hernia.  Lymphadenopathy:    She has no cervical adenopathy.    She has no axillary adenopathy.  Neurological: She is alert and oriented to person, place, and time. She has normal strength and normal reflexes.  No cranial nerve deficit or sensory deficit. She exhibits normal muscle tone. She displays a negative Romberg sign. Coordination and gait normal. GCS eye subscore is 4. GCS verbal subscore is 5. GCS motor subscore is 6.  Nml cerebellar exam   No papilledema  Skin: Skin is warm, dry and intact. No rash noted.  Psychiatric: She has a normal mood and affect. Her speech is normal and behavior is normal. Judgment and thought content normal. Her mood appears not anxious. Cognition and memory are normal. Cognition and memory are not impaired. She does not exhibit a depressed mood. She exhibits normal recent memory and normal remote memory.    No change in bony prominence of right anterior SCjoint      Assessment & Plan:  The patient's preventative maintenance and recommended screening tests for an annual wellness exam were reviewed in full today. Brought up to date unless services declined.  Counselled on the importance of diet, exercise, and its role in overall health and mortality. The patient's FH and SH was reviewed, including their home life, tobacco status, and drug and alcohol status.   Vaccines: Given flu vaccine today Pap/DVE:  Per GYN Mammo:  At GYN yearly. Bone Density: per GYN Colon: Dr. Amedeo Plenty, nml... Plans iFOB today.. Eventually will plan repeat colon.  Smoking Status:none ETOH/ drug ZLD:JTTS/VXBL  Hep C: done

## 2017-11-08 NOTE — Assessment & Plan Note (Addendum)
Eval with labs... decrease bladder stimulants.. Decrease caffeine. Increase water,drink fluids earlier in day, limit after 4 PM, decrease sweet tea.

## 2017-11-08 NOTE — Addendum Note (Signed)
Addended by: Carter Kitten on: 11/08/2017 09:26 AM   Modules accepted: Orders

## 2017-11-08 NOTE — Assessment & Plan Note (Signed)
Well controlled. Continue current medication.  

## 2017-11-12 ENCOUNTER — Encounter: Payer: Self-pay | Admitting: Family Medicine

## 2017-11-13 MED ORDER — SIMVASTATIN 20 MG PO TABS: 20.0000 mg | ORAL_TABLET | Freq: Every day | ORAL | 3 refills | Status: DC

## 2017-11-13 NOTE — Addendum Note (Signed)
Addended by: Carter Kitten on: 11/13/2017 10:42 AM   Modules accepted: Orders

## 2017-11-21 ENCOUNTER — Other Ambulatory Visit (INDEPENDENT_AMBULATORY_CARE_PROVIDER_SITE_OTHER): Payer: 59

## 2017-11-21 DIAGNOSIS — Z1211 Encounter for screening for malignant neoplasm of colon: Secondary | ICD-10-CM

## 2017-11-21 LAB — FECAL OCCULT BLOOD, IMMUNOCHEMICAL: Fecal Occult Bld: NEGATIVE

## 2017-11-22 ENCOUNTER — Encounter: Payer: Self-pay | Admitting: *Deleted

## 2017-11-29 ENCOUNTER — Ambulatory Visit (INDEPENDENT_AMBULATORY_CARE_PROVIDER_SITE_OTHER): Payer: 59 | Admitting: Family Medicine

## 2017-11-29 ENCOUNTER — Encounter: Payer: Self-pay | Admitting: Family Medicine

## 2017-11-29 DIAGNOSIS — R739 Hyperglycemia, unspecified: Secondary | ICD-10-CM | POA: Diagnosis not present

## 2017-11-29 DIAGNOSIS — E785 Hyperlipidemia, unspecified: Secondary | ICD-10-CM

## 2017-11-29 DIAGNOSIS — Z87898 Personal history of other specified conditions: Secondary | ICD-10-CM | POA: Insufficient documentation

## 2017-11-29 NOTE — Assessment & Plan Note (Signed)
Likely new dx DM.Elizabeth Fitzpatrick Pt hesitant to accept Dx.. Feels she had very poor diet 3 weeks prior to test.  Will work on [employer] changes and rechcek in 3 monhts.  reviewed info on DM.Elizabeth Fitzpatrick Risks, complications etc. Offered nutritionist.

## 2017-11-29 NOTE — Patient Instructions (Addendum)
Call if interested in nutritionist.  Work on low carb low cholesterol diet. Keep up the walking.  Stay on simvastatin daily.

## 2017-11-29 NOTE — Progress Notes (Signed)
Subjective:    Patient ID: Elizabeth Fitzpatrick, female    DOB: [birth year], 62 y.o.   MRN: [id number]  Diabetes  Pertinent negatives for hypoglycemia include no headaches. Pertinent negatives for diabetes include no chest pain and no fatigue.   61 year old female presents for new dx DM.   She has gained weight  In last few years.  Does have increase in urinary frequency.   Wt Readings from Last 3 Encounters:  11/29/17 206 lb 12 oz (93.8 kg)  11/08/17 215 lb 8 oz (97.8 kg)  06/07/17 215 lb (97.5 kg)   She has lost 10 lbs in last few weeks.. She has cut out sweet, walking more.  Diabetes:   Lab Results  Component Value Date   HGBA1C 7.8 (H) 11/08/2017  Using medications without difficulties: Hypoglycemic episodes: Hyperglycemic episodes: Feet problems: Blood Sugars averaging: eye exam within last year:  LDL not at goal on statin.. 20 simvastatin Lab Results  Component Value Date   CHOL 233 (H) 11/08/2017   HDL 45.20 11/08/2017   LDLCALC 128 (H) 10/03/2015   LDLDIRECT 151.0 11/08/2017   TRIG 230.0 (H) 11/08/2017   CHOLHDL 5 11/08/2017    BP not at goal on amlodipine  not on ACEI   Blood pressure 140/70, pulse 71, temperature 98.9 F (37.2 C), temperature source Oral, height 5' 7.5" (1.715 m), weight 206 lb 12 oz (93.8 kg). Social History /Family History/Past Medical History reviewed in detail and updated in EMR if needed.    Review of Systems  Constitutional: Negative for fatigue and fever.  HENT: Negative for congestion.   Eyes: Negative for pain.  Respiratory: Negative for cough and shortness of breath.   Cardiovascular: Negative for chest pain, palpitations and leg swelling.  Gastrointestinal: Negative for abdominal pain.  Genitourinary: Negative for dysuria and vaginal bleeding.  Musculoskeletal: Negative for back pain.  Neurological: Negative for syncope, light-headedness and headaches.  Psychiatric/Behavioral: Negative for dysphoric mood.         Objective:   Physical Exam  Constitutional: Vital signs are normal. She appears well-developed and well-nourished. She is cooperative.  Non-toxic appearance. She does not appear ill. No distress.  HENT:  Head: Normocephalic.  Right Ear: Hearing, tympanic membrane, external ear and ear canal normal. Tympanic membrane is not erythematous, not retracted and not bulging.  Left Ear: Hearing, tympanic membrane, external ear and ear canal normal. Tympanic membrane is not erythematous, not retracted and not bulging.  Nose: No mucosal edema or rhinorrhea. Right sinus exhibits no maxillary sinus tenderness and no frontal sinus tenderness. Left sinus exhibits no maxillary sinus tenderness and no frontal sinus tenderness.  Mouth/Throat: Uvula is midline, oropharynx is clear and moist and mucous membranes are normal.  Eyes: Pupils are equal, round, and reactive to light. Conjunctivae, EOM and lids are normal. Lids are everted and swept, no foreign bodies found.  Neck: Trachea normal and normal range of motion. Neck supple. Carotid bruit is not present. No thyroid mass and no thyromegaly present.  Cardiovascular: Normal rate, regular rhythm, S1 normal, S2 normal, normal heart sounds, intact distal pulses and normal pulses. Exam reveals no gallop and no friction rub.  No murmur heard. Pulmonary/Chest: Effort normal and breath sounds normal. No tachypnea. No respiratory distress. She has no decreased breath sounds. She has no wheezes. She has no rhonchi. She has no rales.  Abdominal: Soft. Normal appearance and bowel sounds are normal. There is no tenderness.  Neurological: She is alert.  Skin: Skin is  warm, dry and intact. No rash noted.  Psychiatric: Her speech is normal and behavior is normal. Judgment and thought content normal. Her mood appears not anxious. Cognition and memory are normal. She does not exhibit a depressed mood.      Diabetic foot exam: Normal inspection No skin breakdown No calluses   Normal DP pulses Normal sensation to light touch and monofilament Nails normal         Assessment & Plan:

## 2017-11-29 NOTE — Assessment & Plan Note (Signed)
On statin. Re-eval in 3 months.

## 2017-12-03 ENCOUNTER — Encounter: Payer: Self-pay | Admitting: Family Medicine

## 2017-12-18 DIAGNOSIS — Z6830 Body mass index (BMI) 30.0-30.9, adult: Secondary | ICD-10-CM | POA: Diagnosis not present

## 2017-12-18 DIAGNOSIS — Z1231 Encounter for screening mammogram for malignant neoplasm of breast: Secondary | ICD-10-CM | POA: Diagnosis not present

## 2017-12-18 DIAGNOSIS — Z01419 Encounter for gynecological examination (general) (routine) without abnormal findings: Secondary | ICD-10-CM | POA: Diagnosis not present

## 2017-12-18 LAB — HM PAP SMEAR: HM Pap smear: NEGATIVE

## 2018-01-01 ENCOUNTER — Encounter: Payer: Self-pay | Admitting: Family Medicine

## 2018-02-03 ENCOUNTER — Other Ambulatory Visit: Payer: Self-pay | Admitting: Family Medicine

## 2018-02-28 ENCOUNTER — Telehealth: Payer: Self-pay | Admitting: Family Medicine

## 2018-02-28 ENCOUNTER — Other Ambulatory Visit (INDEPENDENT_AMBULATORY_CARE_PROVIDER_SITE_OTHER): Payer: BLUE CROSS/BLUE SHIELD

## 2018-02-28 DIAGNOSIS — E785 Hyperlipidemia, unspecified: Secondary | ICD-10-CM

## 2018-02-28 DIAGNOSIS — E038 Other specified hypothyroidism: Secondary | ICD-10-CM

## 2018-02-28 LAB — COMPREHENSIVE METABOLIC PANEL
ALT: 26 U/L (ref 0–35)
AST: 22 U/L (ref 0–37)
Albumin: 4.7 g/dL (ref 3.5–5.2)
Alkaline Phosphatase: 69 U/L (ref 39–117)
BUN: 12 mg/dL (ref 6–23)
CO2: 31 mEq/L (ref 19–32)
Calcium: 10.2 mg/dL (ref 8.4–10.5)
Chloride: 103 mEq/L (ref 96–112)
Creatinine, Ser: 0.87 mg/dL (ref 0.40–1.20)
GFR: 66.16 mL/min (ref >60.00)
Glucose, Bld: 87 mg/dL (ref 70–99)
Potassium: 3.9 mEq/L (ref 3.5–5.1)
Sodium: 142 mEq/L (ref 135–145)
Total Bilirubin: 1.1 mg/dL (ref 0.2–1.2)
Total Protein: 6.9 g/dL (ref 6.0–8.3)

## 2018-02-28 LAB — TSH: TSH: 0.47 u[IU]/mL (ref 0.35–4.50)

## 2018-02-28 LAB — T3, FREE: T3, Free: 3 pg/mL (ref 2.3–4.2)

## 2018-02-28 LAB — T4, FREE: Free T4: 1.17 ng/dL (ref 0.60–1.60)

## 2018-02-28 LAB — LIPID PANEL
Cholesterol: 126 mg/dL (ref 0–200)
HDL: 46.4 mg/dL (ref >39.00)
LDL Cholesterol: 66 mg/dL (ref 0–99)
NonHDL: 79.74
Total CHOL/HDL Ratio: 3
Triglycerides: 69 mg/dL (ref 0.0–149.0)
VLDL: 13.8 mg/dL (ref 0.0–40.0)

## 2018-02-28 NOTE — Telephone Encounter (Signed)
-----   Message from Lendon Collar, RT sent at 02/18/2018  1:09 PM EST ----- Regarding: Lab orders for Friday 02/28/18 Please enter 6month follow up lab orders for 02/28/18. Thanks!

## 2018-03-07 ENCOUNTER — Ambulatory Visit: Payer: BLUE CROSS/BLUE SHIELD | Admitting: Family Medicine

## 2018-03-07 ENCOUNTER — Encounter: Payer: Self-pay | Admitting: Family Medicine

## 2018-03-07 VITALS — BP 122/80 | HR 62 | Temp 98.6°F | Ht 67.5 in | Wt 178.8 lb

## 2018-03-07 DIAGNOSIS — R739 Hyperglycemia, unspecified: Secondary | ICD-10-CM

## 2018-03-07 DIAGNOSIS — E038 Other specified hypothyroidism: Secondary | ICD-10-CM

## 2018-03-07 DIAGNOSIS — E785 Hyperlipidemia, unspecified: Secondary | ICD-10-CM

## 2018-03-07 DIAGNOSIS — I10 Essential (primary) hypertension: Secondary | ICD-10-CM

## 2018-03-07 DIAGNOSIS — E2839 Other primary ovarian failure: Secondary | ICD-10-CM

## 2018-03-07 DIAGNOSIS — Z87898 Personal history of other specified conditions: Secondary | ICD-10-CM

## 2018-03-07 LAB — POCT GLYCOSYLATED HEMOGLOBIN (HGB A1C): Hemoglobin A1C: 5.3 % (ref 4.0–5.6)

## 2018-03-07 NOTE — Patient Instructions (Addendum)
Keep up the great work!  We will call with bone density appointment.

## 2018-03-07 NOTE — Assessment & Plan Note (Signed)
Well controlled. Continue current medication. On statin. 

## 2018-03-07 NOTE — Assessment & Plan Note (Signed)
Excellent improvement back on statin and with lifestyle changes.

## 2018-03-07 NOTE — Progress Notes (Signed)
Subjective:    Patient ID: Elizabeth Fitzpatrick, female    DOB: 1955-07-12, 63 y.o.   MRN: [id number]  HPI 63 year old female presents for 3 month follow up hyperglycemia.  She has been working on [employer] and regular exercise.  She has lost 28 lbs in last 3 months! Lab Results  Component Value Date   HGBA1C 5.3 03/07/2018   Wt Readings from Last 3 Encounters:  03/07/18 178 lb 12 oz (81.1 kg)  11/29/17 206 lb 12 oz (93.8 kg)  11/08/17 215 lb 8 oz (97.8 kg)   Elevated Cholesterol:  LDL now at goal.. down to 66 from 151 on simvastatin. Lab Results  Component Value Date   CHOL 126 02/28/2018   HDL 46.40 02/28/2018   LDLCALC 66 02/28/2018   LDLDIRECT 151.0 11/08/2017   TRIG 69.0 02/28/2018   CHOLHDL 3 02/28/2018  Using medications without problems:none Muscle aches: none Diet compliance: decreased snacking.. eating better, less sweets. Exercise: walking more.. 2 times daily Other complaints: Body mass index is 27.58 kg/m.  Hypertension:    Using medication without problems or lightheadedness:  Chest pain with exertion: Edema: Short of breath: Average home BPs: Other issues:  Hypothyroid  Lab Results  Component Value Date   TSH 0.47 02/28/2018     Social History /Family History/Past Medical History reviewed in detail and updated in EMR if needed. Blood pressure 122/80, pulse 62, temperature 98.6 F (37 C), temperature source Oral, height 5' 7.5" (1.715 m), weight 178 lb 12 oz (81.1 kg).  Review of Systems  Constitutional: Negative for fatigue and fever.  HENT: Negative for congestion.   Eyes: Negative for pain.  Respiratory: Negative for cough and shortness of breath.   Cardiovascular: Negative for chest pain, palpitations and leg swelling.  Gastrointestinal: Negative for abdominal pain.  Genitourinary: Negative for dysuria and vaginal bleeding.  Musculoskeletal: Negative for back pain.  Neurological: Negative for syncope, light-headedness and headaches.    Psychiatric/Behavioral: Negative for dysphoric mood.       Objective:   Physical Exam Constitutional:      General: She is not in acute distress.    Appearance: Normal appearance. She is well-developed. She is not ill-appearing or toxic-appearing.  HENT:     Head: Normocephalic.     Right Ear: Hearing, tympanic membrane, ear canal and external ear normal. Tympanic membrane is not erythematous, retracted or bulging.     Left Ear: Hearing, tympanic membrane, ear canal and external ear normal. Tympanic membrane is not erythematous, retracted or bulging.     Nose: No mucosal edema or rhinorrhea.     Right Sinus: No maxillary sinus tenderness or frontal sinus tenderness.     Left Sinus: No maxillary sinus tenderness or frontal sinus tenderness.     Mouth/Throat:     Pharynx: Uvula midline.  Eyes:     General: Lids are normal. Lids are everted, no foreign bodies appreciated.     Conjunctiva/sclera: Conjunctivae normal.     Pupils: Pupils are equal, round, and reactive to light.  Neck:     Musculoskeletal: Normal range of motion and neck supple.     Thyroid: No thyroid mass or thyromegaly.     Vascular: No carotid bruit.     Trachea: Trachea normal.  Cardiovascular:     Rate and Rhythm: Normal rate and regular rhythm.     Pulses: Normal pulses.     Heart sounds: Normal heart sounds, S1 normal and S2 normal. No murmur. No friction  rub. No gallop.   Pulmonary:     Effort: Pulmonary effort is normal. No tachypnea or respiratory distress.     Breath sounds: Normal breath sounds. No decreased breath sounds, wheezing, rhonchi or rales.  Abdominal:     General: Bowel sounds are normal.     Palpations: Abdomen is soft.     Tenderness: There is no abdominal tenderness.  Skin:    General: Skin is warm and dry.     Findings: No rash.  Neurological:     Mental Status: She is alert.  Psychiatric:        Mood and Affect: Mood is not anxious or depressed.        Speech: Speech normal.         Behavior: Behavior normal. Behavior is cooperative.        Thought Content: Thought content normal.        Judgment: Judgment normal.           Assessment & Plan:

## 2018-03-07 NOTE — Assessment & Plan Note (Signed)
A1C back in nl range with weight loss and diet change. Follow A1C yearly.

## 2018-03-07 NOTE — Assessment & Plan Note (Signed)
Well controlled. Continue current medication.  

## 2018-05-19 ENCOUNTER — Other Ambulatory Visit: Payer: BLUE CROSS/BLUE SHIELD

## 2018-07-04 ENCOUNTER — Ambulatory Visit
Admission: RE | Admit: 2018-07-04 | Discharge: 2018-07-04 | Disposition: A | Discharge status: Home / Self Care | Payer: BC Managed Care – PPO | Source: Ambulatory Visit | Attending: Family Medicine | Admitting: Family Medicine

## 2018-07-04 ENCOUNTER — Other Ambulatory Visit: Payer: Self-pay

## 2018-07-04 DIAGNOSIS — Z780 Asymptomatic menopausal state: Secondary | ICD-10-CM | POA: Diagnosis not present

## 2018-07-04 DIAGNOSIS — M85851 Other specified disorders of bone density and structure, right thigh: Secondary | ICD-10-CM | POA: Diagnosis not present

## 2018-07-04 DIAGNOSIS — E2839 Other primary ovarian failure: Secondary | ICD-10-CM

## 2018-07-07 ENCOUNTER — Other Ambulatory Visit: Payer: BLUE CROSS/BLUE SHIELD

## 2018-07-21 ENCOUNTER — Telehealth: Payer: Self-pay | Admitting: Family Medicine

## 2018-07-21 NOTE — Telephone Encounter (Signed)
Please call pt and schedule for shringrix, Best days are Tues 7/14, 7/21, 7/28, 8/4 and 8/11, Slots are 1130, 1145, 1200, 230, 245, 300, 315, and 330.

## 2018-07-22 NOTE — Telephone Encounter (Signed)
Donna/Mandy can you set this up with pt?

## 2018-07-22 NOTE — Telephone Encounter (Signed)
Left message for patient to call back to schedule.  °

## 2018-07-22 NOTE — Telephone Encounter (Signed)
Shingrix vaccine scheduled for 07/31/2018 @ 8:30 am on nurse schedule.

## 2018-07-31 ENCOUNTER — Other Ambulatory Visit: Payer: Self-pay

## 2018-07-31 ENCOUNTER — Ambulatory Visit (INDEPENDENT_AMBULATORY_CARE_PROVIDER_SITE_OTHER): Payer: BC Managed Care – PPO

## 2018-07-31 DIAGNOSIS — Z23 Encounter for immunization: Secondary | ICD-10-CM | POA: Diagnosis not present

## 2018-07-31 IMAGING — MG 2D DIGITAL DIAGNOSTIC BILATERAL MAMMOGRAM WITH CAD AND ADJUNCT T
3 series · 3 of 7 positions shown · non-contrast
Comparison: Previous exam(s).

CLINICAL DATA: The patient presented for her annual screening
mammogram. After the initial four images were performed, she
mentioned to the technologist that she has felt a lump in the right
breast for approximately 2 weeks in the periareolar upper inner
right breast. Therefore, she was brought back later in the day for
additional evaluation of the right breast. She states that she
palpates a rounded area in the periareolar upper inner quadrant of
the right breast.

EXAM:
2D DIGITAL DIAGNOSTIC BILATERAL MAMMOGRAM WITH CAD AND ADJUNCT TOMO
ULTRASOUND RIGHT BREAST

[R CC synth-2D]
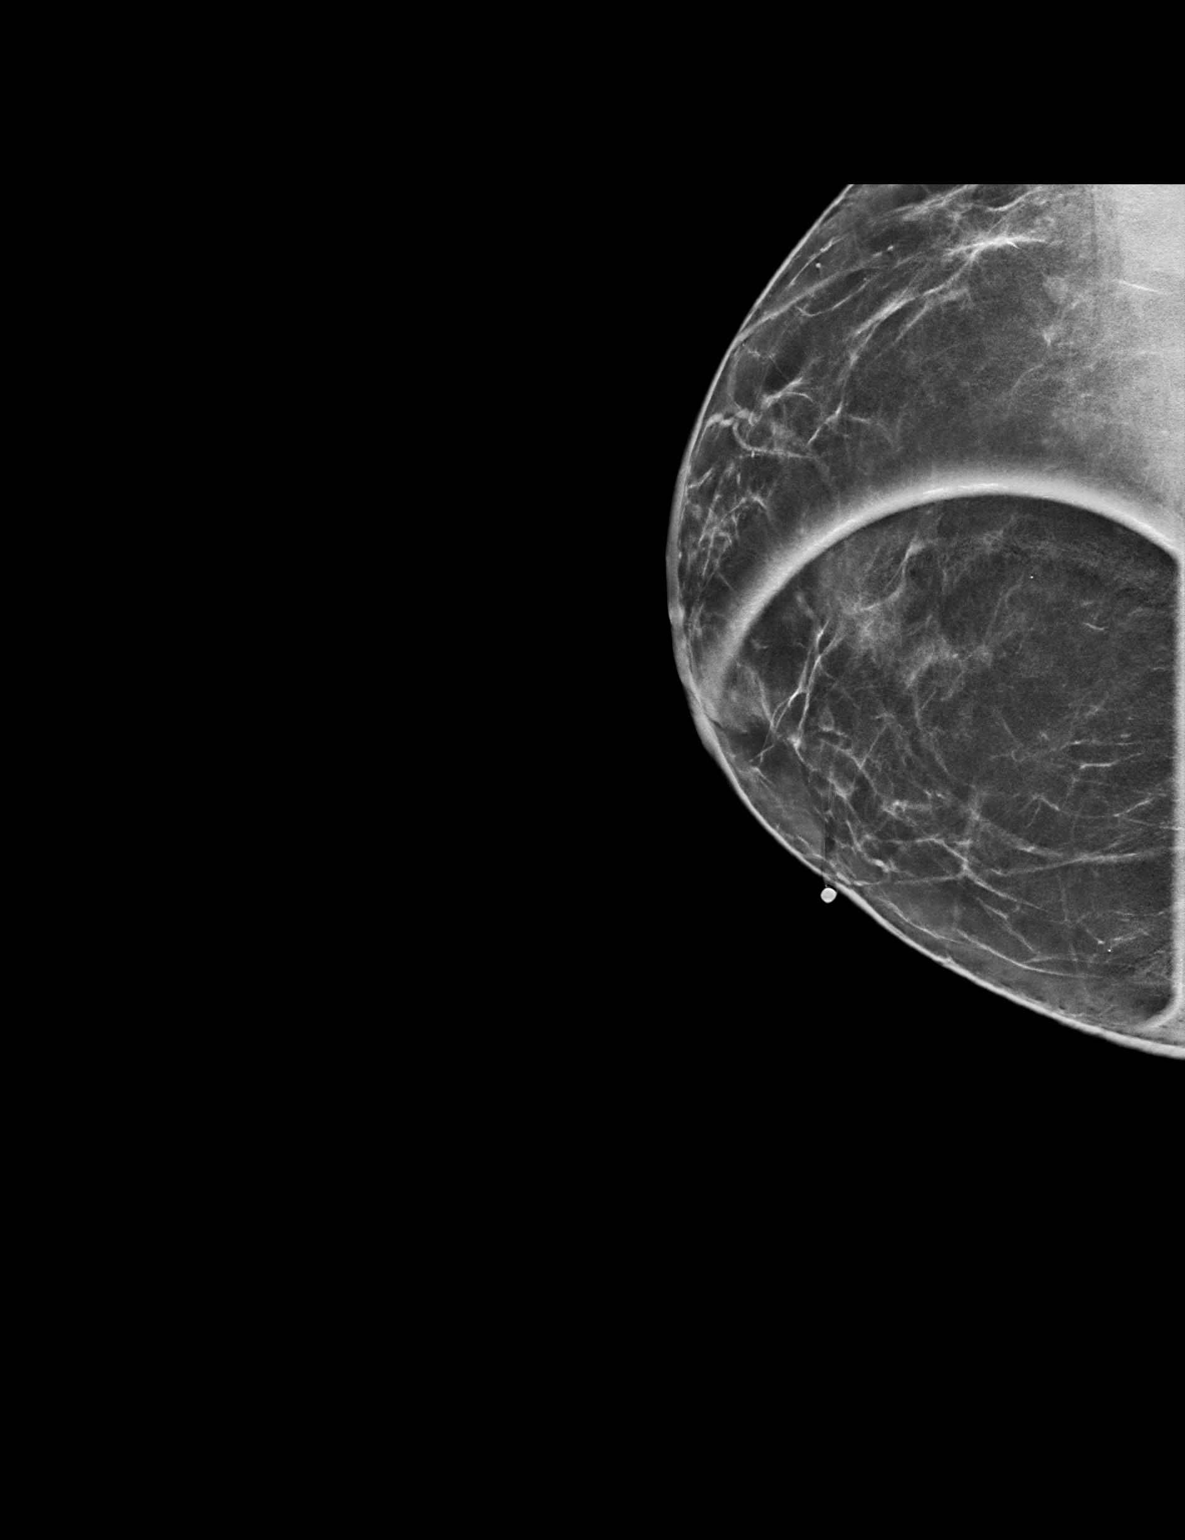

[R CC]
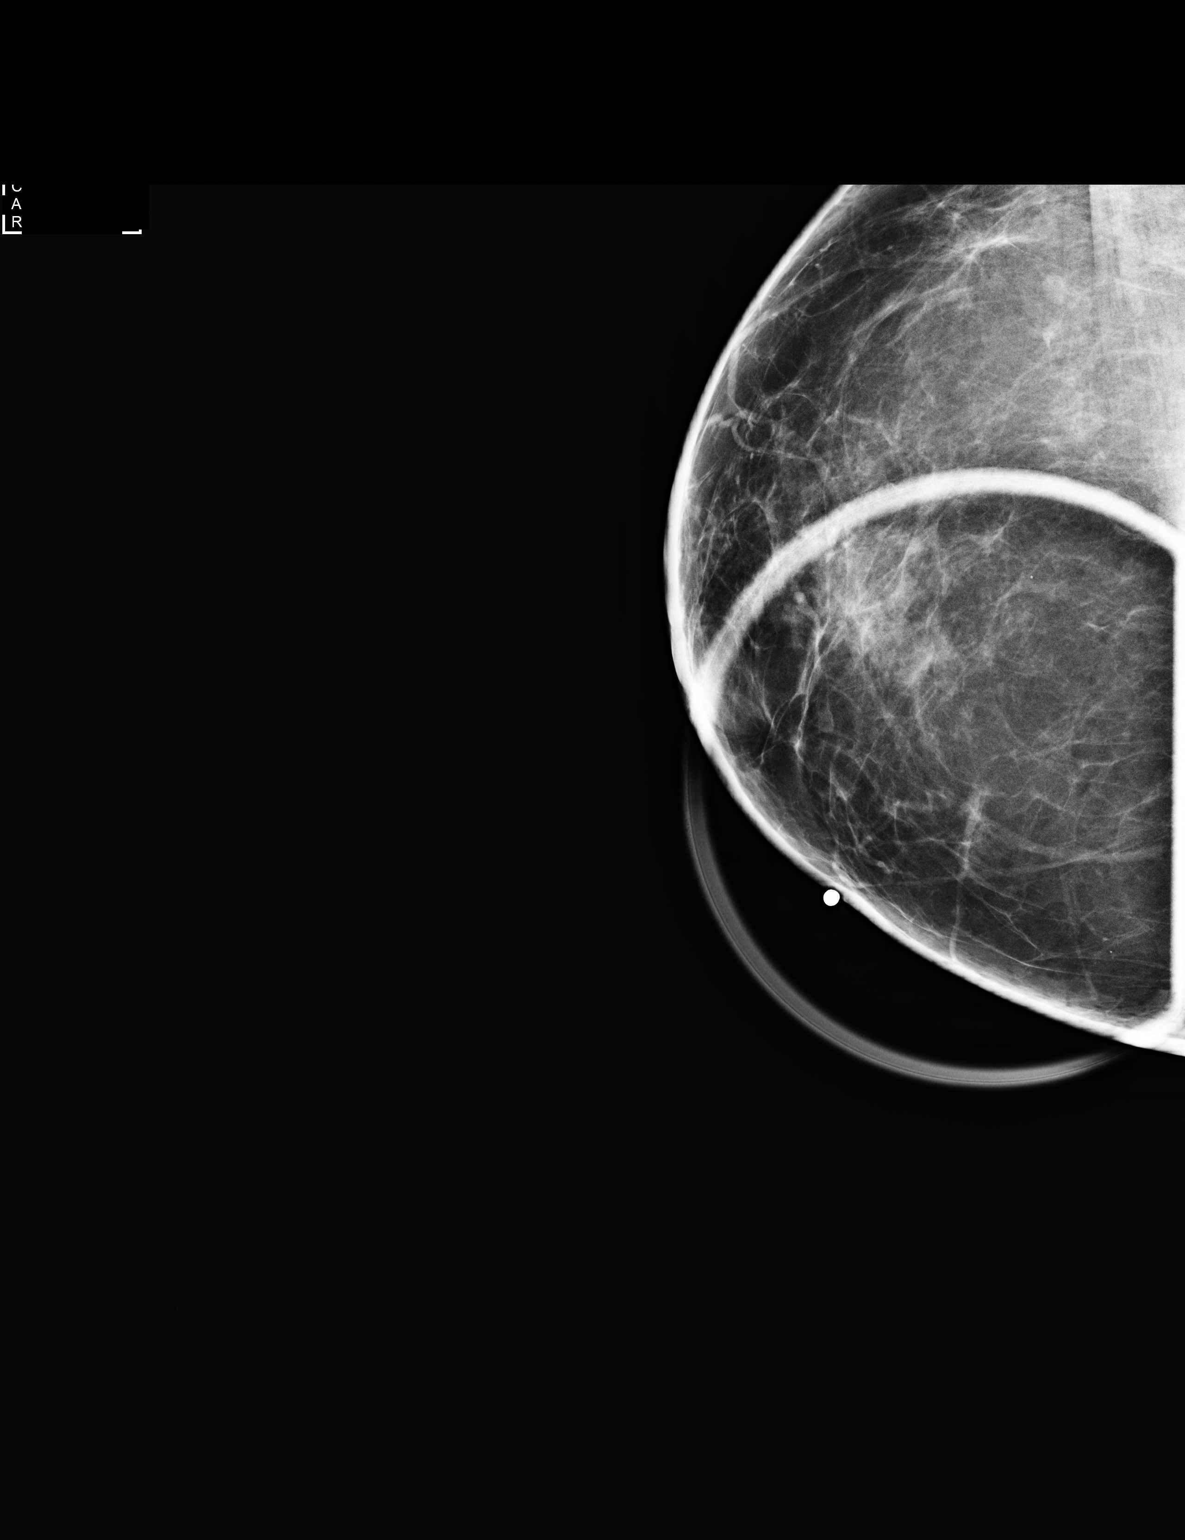

[R CC tomo · tomo slice 31/62.0]
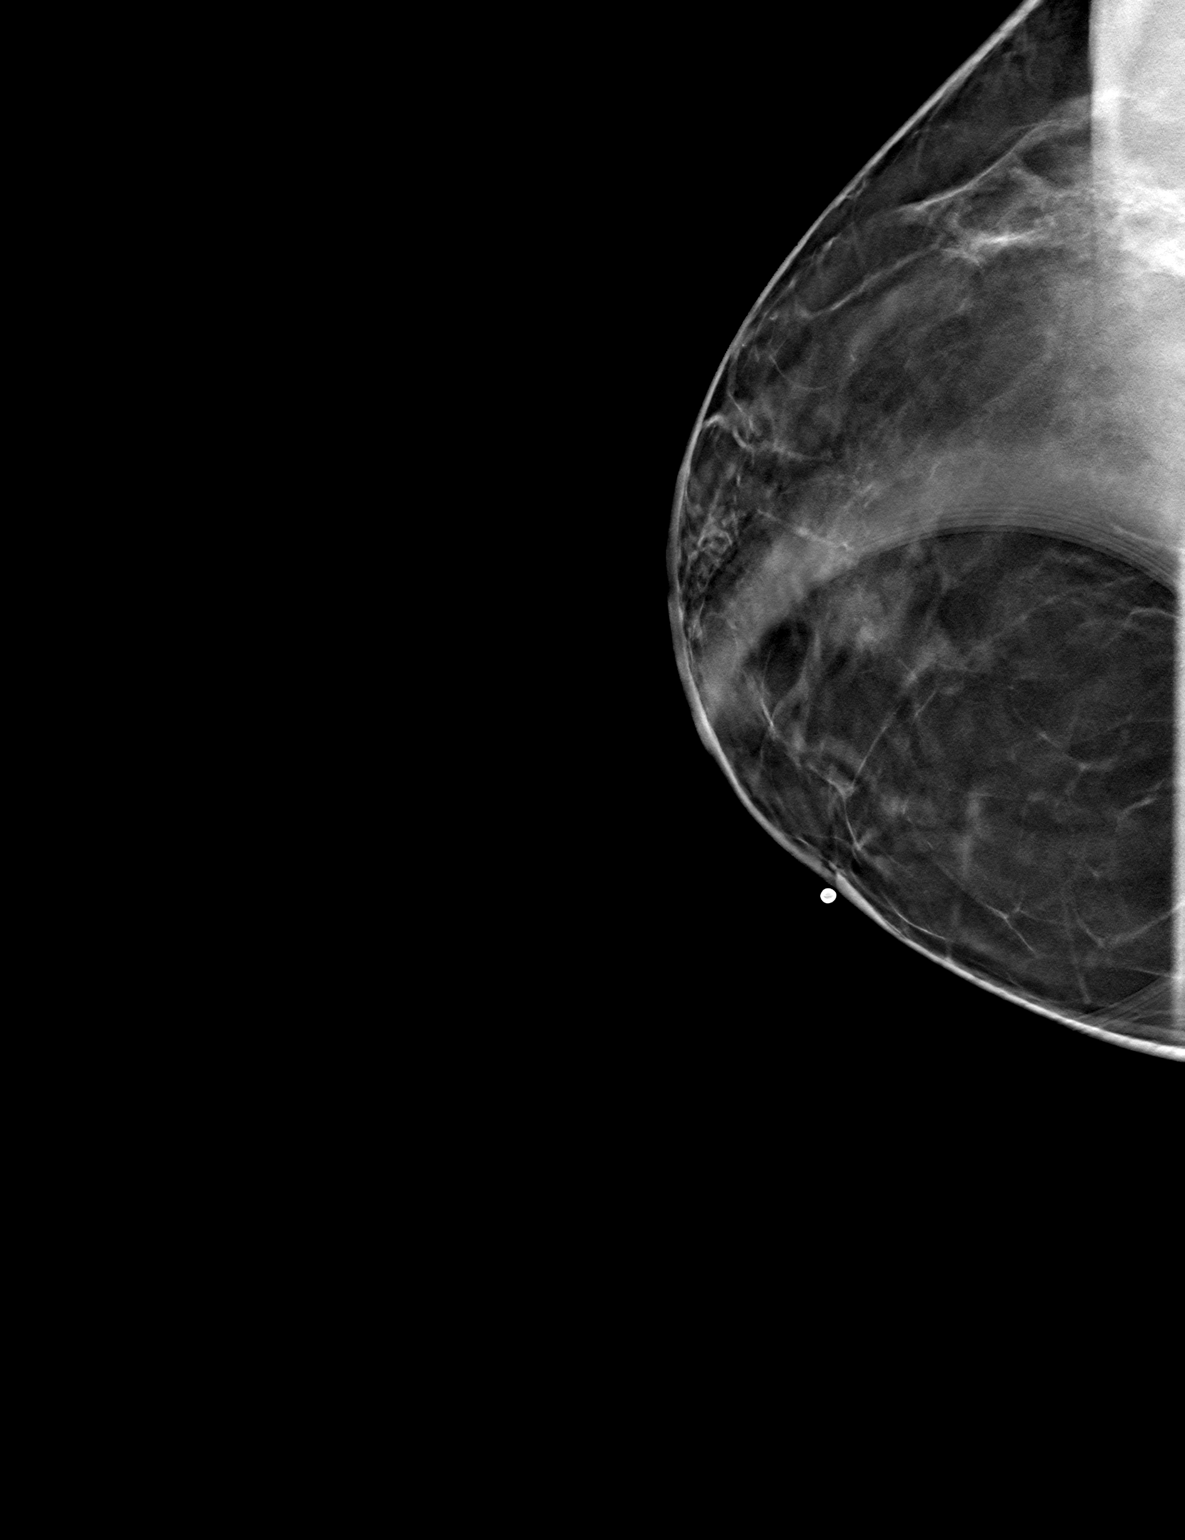

[3 of 7 positions shown; findings below may reference images not displayed]

ACR Breast Density Category b: There are scattered areas of
fibroglandular density.
FINDINGS: No mass, architectural distortion, or suspicious microcalcification
is identified in either breast to suggest malignancy. A spot
tangential view of the region of patient concern in the medial right
breast is negative. There is predominately fatty breast parenchyma
in this region of patient concern.

Mammographic images were processed with CAD.

On physical exam, I examined the patient sitting upright and lying
supine. The breast feels soft on palpation. I do not palpate a mass
in her area of concern in the upper inner periareolar right breast.
The skin appears normal. The nipple areolar complex is normal.

Targeted ultrasound is performed, showing normal predominant fatty
breast parenchyma. No solid or cystic mass or abnormal shadowing is
identified.
IMPRESSION: No evidence of malignancy in either breast.

RECOMMENDATION:
Screening mammogram in one year.(Code:AE-S-PS2)

I have discussed the findings and recommendations with the patient.
Results were also provided in writing at the conclusion of the
visit. If applicable, a reminder letter will be sent to the patient
regarding the next appointment.

BI-RADS CATEGORY  1: Negative.

## 2018-10-16 ENCOUNTER — Ambulatory Visit (INDEPENDENT_AMBULATORY_CARE_PROVIDER_SITE_OTHER): Payer: BC Managed Care – PPO

## 2018-10-16 DIAGNOSIS — Z23 Encounter for immunization: Secondary | ICD-10-CM

## 2018-10-22 ENCOUNTER — Encounter: Payer: Self-pay | Admitting: Family Medicine

## 2018-10-22 DIAGNOSIS — H25013 Cortical age-related cataract, bilateral: Secondary | ICD-10-CM | POA: Diagnosis not present

## 2018-10-22 DIAGNOSIS — H2513 Age-related nuclear cataract, bilateral: Secondary | ICD-10-CM | POA: Diagnosis not present

## 2018-10-22 DIAGNOSIS — H04123 Dry eye syndrome of bilateral lacrimal glands: Secondary | ICD-10-CM | POA: Diagnosis not present

## 2018-10-22 DIAGNOSIS — H35363 Drusen (degenerative) of macula, bilateral: Secondary | ICD-10-CM | POA: Diagnosis not present

## 2018-10-24 ENCOUNTER — Encounter: Payer: Self-pay | Admitting: Family Medicine

## 2018-10-31 ENCOUNTER — Telehealth: Payer: Self-pay | Admitting: *Deleted

## 2018-10-31 NOTE — Telephone Encounter (Signed)
When patient calls back covid screening needs to be done. Car instructions need to be given for nurse visit.

## 2018-11-01 ENCOUNTER — Other Ambulatory Visit: Payer: Self-pay | Admitting: Family Medicine

## 2018-11-04 ENCOUNTER — Ambulatory Visit: Payer: Self-pay

## 2018-11-24 ENCOUNTER — Other Ambulatory Visit: Payer: 59

## 2018-11-28 ENCOUNTER — Encounter: Payer: 59 | Admitting: Family Medicine

## 2018-12-10 ENCOUNTER — Ambulatory Visit (INDEPENDENT_AMBULATORY_CARE_PROVIDER_SITE_OTHER): Payer: BC Managed Care – PPO

## 2018-12-10 DIAGNOSIS — Z23 Encounter for immunization: Secondary | ICD-10-CM

## 2018-12-22 DIAGNOSIS — Z6821 Body mass index (BMI) 21.0-21.9, adult: Secondary | ICD-10-CM | POA: Diagnosis not present

## 2018-12-22 DIAGNOSIS — R3915 Urgency of urination: Secondary | ICD-10-CM | POA: Diagnosis not present

## 2018-12-22 DIAGNOSIS — Z01419 Encounter for gynecological examination (general) (routine) without abnormal findings: Secondary | ICD-10-CM | POA: Diagnosis not present

## 2018-12-22 DIAGNOSIS — Z1231 Encounter for screening mammogram for malignant neoplasm of breast: Secondary | ICD-10-CM | POA: Diagnosis not present

## 2018-12-23 ENCOUNTER — Other Ambulatory Visit: Payer: Self-pay | Admitting: Obstetrics and Gynecology

## 2018-12-23 DIAGNOSIS — N6489 Other specified disorders of breast: Secondary | ICD-10-CM

## 2018-12-29 ENCOUNTER — Other Ambulatory Visit: Payer: Self-pay

## 2018-12-29 ENCOUNTER — Ambulatory Visit
Admission: RE | Admit: 2018-12-29 | Discharge: 2018-12-29 | Disposition: A | Discharge status: Home / Self Care | Payer: BC Managed Care – PPO | Source: Ambulatory Visit | Attending: Obstetrics and Gynecology | Admitting: Obstetrics and Gynecology

## 2018-12-29 DIAGNOSIS — N6001 Solitary cyst of right breast: Secondary | ICD-10-CM | POA: Diagnosis not present

## 2018-12-29 DIAGNOSIS — N6489 Other specified disorders of breast: Secondary | ICD-10-CM

## 2018-12-29 DIAGNOSIS — R928 Other abnormal and inconclusive findings on diagnostic imaging of breast: Secondary | ICD-10-CM | POA: Diagnosis not present

## 2018-12-31 ENCOUNTER — Telehealth: Payer: Self-pay

## 2018-12-31 NOTE — Telephone Encounter (Signed)
LVM to call clinic, needs COVID screen, front door and back lab info 12.9.2020 TLJ

## 2019-01-01 ENCOUNTER — Telehealth: Payer: Self-pay | Admitting: Family Medicine

## 2019-01-01 DIAGNOSIS — E038 Other specified hypothyroidism: Secondary | ICD-10-CM

## 2019-01-01 DIAGNOSIS — E785 Hyperlipidemia, unspecified: Secondary | ICD-10-CM

## 2019-01-01 DIAGNOSIS — Z87898 Personal history of other specified conditions: Secondary | ICD-10-CM

## 2019-01-01 NOTE — Telephone Encounter (Signed)
-----   Message from Cloyd Stagers, RT sent at 12/24/2018  8:37 AM EST ----- Regarding: Lab Orders for Friday 12.11.2020 Please place lab orders for Friday 12.11.2020, office visit for physical on Friday 12.18.2020 Thank you, Dyke Maes RT(R)

## 2019-01-02 ENCOUNTER — Other Ambulatory Visit (INDEPENDENT_AMBULATORY_CARE_PROVIDER_SITE_OTHER): Payer: BC Managed Care – PPO

## 2019-01-02 ENCOUNTER — Other Ambulatory Visit: Payer: Self-pay

## 2019-01-02 DIAGNOSIS — E038 Other specified hypothyroidism: Secondary | ICD-10-CM

## 2019-01-02 DIAGNOSIS — E785 Hyperlipidemia, unspecified: Secondary | ICD-10-CM

## 2019-01-02 DIAGNOSIS — Z87898 Personal history of other specified conditions: Secondary | ICD-10-CM | POA: Diagnosis not present

## 2019-01-02 LAB — COMPREHENSIVE METABOLIC PANEL
ALT: 32 U/L (ref 0–35)
AST: 30 U/L (ref 0–37)
Albumin: 4.9 g/dL (ref 3.5–5.2)
Alkaline Phosphatase: 87 U/L (ref 39–117)
BUN: 17 mg/dL (ref 6–23)
CO2: 32 mEq/L (ref 19–32)
Calcium: 10.4 mg/dL (ref 8.4–10.5)
Chloride: 102 mEq/L (ref 96–112)
Creatinine, Ser: 0.81 mg/dL (ref 0.40–1.20)
GFR: 71.64 mL/min (ref >60.00)
Glucose, Bld: 80 mg/dL (ref 70–99)
Potassium: 3.9 mEq/L (ref 3.5–5.1)
Sodium: 142 mEq/L (ref 135–145)
Total Bilirubin: 1.1 mg/dL (ref 0.2–1.2)
Total Protein: 7.1 g/dL (ref 6.0–8.3)

## 2019-01-02 LAB — TSH: TSH: 1.1 u[IU]/mL (ref 0.35–4.50)

## 2019-01-02 LAB — LIPID PANEL
Cholesterol: 183 mg/dL (ref 0–200)
HDL: 76.3 mg/dL (ref >39.00)
LDL Cholesterol: 94 mg/dL (ref 0–99)
NonHDL: 106.99
Total CHOL/HDL Ratio: 2
Triglycerides: 66 mg/dL (ref 0.0–149.0)
VLDL: 13.2 mg/dL (ref 0.0–40.0)

## 2019-01-02 LAB — T4, FREE: Free T4: 1.05 ng/dL (ref 0.60–1.60)

## 2019-01-02 LAB — HEMOGLOBIN A1C: Hgb A1c MFr Bld: 5.2 % (ref 4.6–6.5)

## 2019-01-02 LAB — T3, FREE: T3, Free: 2.6 pg/mL (ref 2.3–4.2)

## 2019-01-02 NOTE — Progress Notes (Signed)
No critical labs need to be addressed urgently. We will discuss labs in detail at upcoming office visit.   

## 2019-01-09 ENCOUNTER — Encounter: Payer: Self-pay | Admitting: Family Medicine

## 2019-01-09 ENCOUNTER — Ambulatory Visit (INDEPENDENT_AMBULATORY_CARE_PROVIDER_SITE_OTHER): Payer: BC Managed Care – PPO | Admitting: Family Medicine

## 2019-01-09 ENCOUNTER — Other Ambulatory Visit: Payer: Self-pay

## 2019-01-09 VITALS — BP 112/70 | HR 48 | Temp 98.3°F | Ht 67.25 in | Wt 138.5 lb

## 2019-01-09 DIAGNOSIS — E785 Hyperlipidemia, unspecified: Secondary | ICD-10-CM

## 2019-01-09 DIAGNOSIS — I10 Essential (primary) hypertension: Secondary | ICD-10-CM

## 2019-01-09 DIAGNOSIS — Z1211 Encounter for screening for malignant neoplasm of colon: Secondary | ICD-10-CM | POA: Diagnosis not present

## 2019-01-09 DIAGNOSIS — R634 Abnormal weight loss: Secondary | ICD-10-CM | POA: Diagnosis not present

## 2019-01-09 DIAGNOSIS — Z0000 Encounter for general adult medical examination without abnormal findings: Secondary | ICD-10-CM

## 2019-01-09 DIAGNOSIS — Z87898 Personal history of other specified conditions: Secondary | ICD-10-CM

## 2019-01-09 DIAGNOSIS — E038 Other specified hypothyroidism: Secondary | ICD-10-CM

## 2019-01-09 LAB — CBC WITH DIFFERENTIAL/PLATELET
Basophils Absolute: 0 10*3/uL (ref 0.0–0.1)
Basophils Relative: 0.5 % (ref 0.0–3.0)
Eosinophils Absolute: 0.2 10*3/uL (ref 0.0–0.7)
Eosinophils Relative: 2.8 % (ref 0.0–5.0)
HCT: 44 % (ref 36.0–46.0)
Hemoglobin: 14.7 g/dL (ref 12.0–15.0)
Lymphocytes Relative: 24.3 % (ref 12.0–46.0)
Lymphs Abs: 1.7 10*3/uL (ref 0.7–4.0)
MCHC: 33.3 g/dL (ref 30.0–36.0)
MCV: 94.2 fl (ref 78.0–100.0)
Monocytes Absolute: 0.7 10*3/uL (ref 0.1–1.0)
Monocytes Relative: 9.8 % (ref 3.0–12.0)
Neutro Abs: 4.3 10*3/uL (ref 1.4–7.7)
Neutrophils Relative %: 62.6 % (ref 43.0–77.0)
Platelets: 181 10*3/uL (ref 150.0–400.0)
RBC: 4.67 Mil/uL (ref 3.87–5.11)
RDW: 12.7 % (ref 11.5–15.5)
WBC: 6.8 10*3/uL (ref 4.0–10.5)

## 2019-01-09 NOTE — Assessment & Plan Note (Signed)
Stable control on current dose. 

## 2019-01-09 NOTE — Assessment & Plan Note (Signed)
At goal on statin 

## 2019-01-09 NOTE — Patient Instructions (Addendum)
Please stop at the lab to have labs drawn. Pick up stool test for colon cancer screen. Hold or half amlodipine.Marland Kitchen as long as BP < 140/90 consistently.. stay on new regimen.

## 2019-01-09 NOTE — Assessment & Plan Note (Signed)
Hold or half amlodipine.Marland Kitchen as long as BP < 140/90 consistently.. stay on new regimen.

## 2019-01-09 NOTE — Addendum Note (Signed)
Addended by: Ellamae Sia on: 01/09/2019 12:21 PM   Modules accepted: Orders

## 2019-01-09 NOTE — Assessment & Plan Note (Signed)
Resolved with weight loss.   

## 2019-01-09 NOTE — Progress Notes (Signed)
Chief Complaint  Patient presents with  . Annual Exam    History of Present Illness: HPI  The patient is here for annual wellness exam and preventative care.     She has been working fairly  hard on diet changes  She has lost almost 60-70 lbs in last year!  She has been eating healthy and exercising. She is more physically active day to day. Limited breads. She is feeling good, no fatigue. No night sweats. She is now retired.   Some increase in urine frequency/nocturia ( does not bother her much) neg UA.  nml pap last year nml mammogram on re-eval this year Colon: Nonsmoker but second hand exposure with mother smoker Aunt's with breast, other with colon No vaginal bleeding, no new constipation/no diarrhea  no skin lesions    Elevated Cholesterol:  LDL at goal on simvastatin Lab Results  Component Value Date   CHOL 183 01/02/2019   HDL 76.30 01/02/2019   LDLCALC 94 01/02/2019   LDLDIRECT 151.0 11/08/2017   TRIG 66.0 01/02/2019   CHOLHDL 2 01/02/2019  Using medications without problems: Muscle aches:  Diet compliance: Exercise: Other complaints:  Hypertension:  At goal on amlodipine   BP Readings from Last 3 Encounters:  01/09/19 112/70  03/07/18 122/80  11/29/17 140/70  Using medication without problems or lightheadedness: none Chest pain with exertion:none Edema:none Short of breath:none Average home BPs: not checking Other issues:   Hypothyroid  Stable control on current dose of levothyroxine Lab Results  Component Value Date   TSH 1.10 01/02/2019   Hx of prediabetes  Lab Results  Component Value Date   HGBA1C 5.2 01/02/2019     This visit occurred during the SARS-CoV-2 public health emergency.  Safety protocols were in place, including screening questions prior to the visit, additional usage of staff PPE, and extensive cleaning of exam room while observing appropriate contact time as indicated for disinfecting solutions.   COVID 19 screen:  No  recent travel or known exposure to COVID19 The patient denies respiratory symptoms of COVID 19 at this time. The importance of social distancing was discussed today.     ROS    Past Medical History:  Diagnosis Date  . GERD (gastroesophageal reflux disease)   . Hyperlipidemia   . Hypertension   . Hypothyroidism     reports that she has never smoked. She has never used smokeless tobacco. She reports that she does not drink alcohol or use drugs.   Current Outpatient Medications:  .  amLODipine (NORVASC) 10 MG tablet, TAKE 1 TABLET (10 MG TOTAL) BY MOUTH DAILY., Disp: 90 tablet, Rfl: 0 .  Calcium-Vitamin D-Vitamin K (CALCIUM SOFT CHEWS) 500-100-40 MG-UNT-MCG CHEW, Chew 2 tablets by mouth daily. , Disp: , Rfl:  .  levothyroxine (SYNTHROID, LEVOTHROID) 75 MCG tablet, TAKE 1 TABLET BY MOUTH DAILY., Disp: 90 tablet, Rfl: 3 .  Multiple Vitamin (MULTIVITAMINS PO), Take 1 tablet by mouth daily.  , Disp: , Rfl:  .  simvastatin (ZOCOR) 20 MG tablet, TAKE 1 TABLET BY MOUTH AT BEDTIME., Disp: 90 tablet, Rfl: 0   Observations/Objective: Blood pressure 112/70, pulse (!) 48, temperature 98.3 F (36.8 C), temperature source Temporal, height 5' 7.25" (1.708 m), weight 138 lb 8 oz (62.8 kg), SpO2 98 %.   Body mass index is 21.53 kg/m.  Wt Readings from Last 3 Encounters:  01/09/19 138 lb 8 oz (62.8 kg)  03/07/18 178 lb 12 oz (81.1 kg)  11/29/17 206 lb 12 oz (93.8 kg)  Physical Exam Constitutional:      General: She is not in acute distress.    Appearance: Normal appearance. She is well-developed and normal weight. She is not ill-appearing, toxic-appearing or diaphoretic.  HENT:     Head: Normocephalic.     Right Ear: Hearing, tympanic membrane, ear canal and external ear normal.     Left Ear: Hearing, tympanic membrane, ear canal and external ear normal.     Nose: Nose normal.  Eyes:     General: Lids are normal. Lids are everted, no foreign bodies appreciated.     Conjunctiva/sclera:  Conjunctivae normal.     Pupils: Pupils are equal, round, and reactive to light.  Neck:     Thyroid: No thyroid mass or thyromegaly.     Vascular: No carotid bruit.     Trachea: Trachea normal.  Cardiovascular:     Rate and Rhythm: Normal rate and regular rhythm.     Heart sounds: Normal heart sounds, S1 normal and S2 normal. No murmur. No gallop.   Pulmonary:     Effort: Pulmonary effort is normal. No respiratory distress.     Breath sounds: Normal breath sounds. No wheezing, rhonchi or rales.  Abdominal:     General: Bowel sounds are normal. There is no distension or abdominal bruit.     Palpations: Abdomen is soft. There is no fluid wave or mass.     Tenderness: There is no abdominal tenderness. There is no guarding or rebound.     Hernia: No hernia is present.  Musculoskeletal:     Cervical back: Normal range of motion and neck supple.  Lymphadenopathy:     Cervical: No cervical adenopathy.  Skin:    General: Skin is warm and dry.     Findings: No rash.  Neurological:     Mental Status: She is alert.     Cranial Nerves: No cranial nerve deficit.     Sensory: No sensory deficit.  Psychiatric:        Mood and Affect: Mood is not anxious or depressed.        Speech: Speech normal.        Behavior: Behavior normal. Behavior is cooperative.        Judgment: Judgment normal.      Assessment and Plan   The patient's preventative maintenance and recommended screening tests for an annual wellness exam were reviewed in full today. Brought up to date unless services declined.  Counselled on the importance of diet, exercise, and its role in overall health and mortality. The patient's FH and SH was reviewed, including their home life, tobacco status, and drug and alcohol status.   Vaccines: flu and shingles uptodate. Pap/DVE:  Per GYN last 2019 Mammo:  At GYN yearly. Last 11/2018 ( further imaging was negative) Bone Density: per GYN Colon: Dr. Amedeo Plenty, nml... Plans iFOB today..  Eventually will plan repeat colon.  Smoking Status:none ETOH/ drug [id number] Hep C: done  Hypertension Hold or half amlodipine.Marland Kitchen as long as BP < 140/90 consistently.. stay on new regimen.  History of prediabetes Resolved with weight loss.  Hyperlipidemia At goal on statin.  Hypothyroidism Stable control on current dose.  Weight loss She has been trying to loose weihgt but given so dramatic.. concern for possible abnormal weight loss.  neg cancer screening  For breast. Thyroid stable.. eval, eval for colon cancer, eval with cbc. No other symptoms ( UA clear at GYN) nonsmoker. Encouraged weight maintenance at this point.. increase protein in diet.  Yulitza Shorts, MD   

## 2019-01-09 NOTE — Assessment & Plan Note (Signed)
She has been trying to loose weihgt but given so dramatic.. concern for possible abnormal weight loss.  neg cancer screening  For breast. Thyroid stable.. eval, eval for colon cancer, eval with cbc. No other symptoms ( UA clear at GYN) nonsmoker.

## 2019-01-14 ENCOUNTER — Other Ambulatory Visit (INDEPENDENT_AMBULATORY_CARE_PROVIDER_SITE_OTHER): Payer: BC Managed Care – PPO

## 2019-01-14 DIAGNOSIS — Z1211 Encounter for screening for malignant neoplasm of colon: Secondary | ICD-10-CM

## 2019-01-14 LAB — FECAL OCCULT BLOOD, IMMUNOCHEMICAL: Fecal Occult Bld: NEGATIVE

## 2019-01-20 ENCOUNTER — Encounter: Payer: Self-pay | Admitting: Family Medicine

## 2019-01-20 ENCOUNTER — Ambulatory Visit: Payer: BC Managed Care – PPO | Admitting: Family Medicine

## 2019-01-20 ENCOUNTER — Other Ambulatory Visit: Payer: Self-pay

## 2019-01-20 DIAGNOSIS — B029 Zoster without complications: Secondary | ICD-10-CM | POA: Diagnosis not present

## 2019-01-20 NOTE — Patient Instructions (Addendum)
Ibuprofen 800 mg three times daily with meals for pain control.   Shingles  Shingles, which is also known as herpes zoster, is an infection that causes a painful skin rash and fluid-filled blisters. It is caused by a virus. Shingles only develops in people who:  Have had chickenpox.  Have been given a medicine to protect against chickenpox (have been vaccinated). Shingles is rare in this group. What are the causes? Shingles is caused by varicella-zoster virus (VZV). This is the same virus that causes chickenpox. After a person is exposed to VZV, the virus stays in the body in an inactive (dormant) state. Shingles develops if the virus is reactivated. This can happen many years after the first (initial) exposure to VZV. It is not known what causes this virus to be reactivated. What increases the risk? People who have had chickenpox or received the chickenpox vaccine are at risk for shingles. Shingles infection is more common in people who:  Are older than age 67.  Have a weakened disease-fighting system (immune system), such as people with: ? HIV. ? AIDS. ? Cancer.  Are taking medicines that weaken the immune system, such as transplant medicines.  Are experiencing a lot of stress. What are the signs or symptoms? Early symptoms of this condition include itching, tingling, and pain in an area on your skin. Pain may be described as burning, stabbing, or throbbing. A few days or weeks after early symptoms start, a painful red rash appears. The rash is usually on one side of the body and has a band-like or belt-like pattern. The rash eventually turns into fluid-filled blisters that break open, change into scabs, and dry up in about 2-3 weeks. At any time during the infection, you may also develop:  A fever.  Chills.  A headache.  An upset stomach. How is this diagnosed? This condition is diagnosed with a skin exam. Skin or fluid samples may be taken from the blisters before a  diagnosis is made. These samples are examined under a microscope or sent to a lab for testing. How is this treated? The rash may last for several weeks. There is not a specific cure for this condition. Your health care provider will probably prescribe medicines to help you manage pain, recover more quickly, and avoid long-term problems. Medicines may include:  Antiviral drugs.  Anti-inflammatory drugs.  Pain medicines.  Anti-itching medicines (antihistamines). If the area involved is on your face, you may be referred to a specialist, such as an eye doctor (ophthalmologist) or an ear, nose, and throat (ENT) doctor (otolaryngologist) to help you avoid eye problems, chronic pain, or disability. Follow these instructions at home: Medicines  Take over-the-counter and prescription medicines only as told by your health care provider.  Apply an anti-itch cream or numbing cream to the affected area as told by your health care provider. Relieving itching and discomfort   Apply cold, wet cloths (cold compresses) to the area of the rash or blisters as told by your health care provider.  Cool baths can be soothing. Try adding baking soda or dry oatmeal to the water to reduce itching. Do not bathe in hot water. Blister and rash care  Keep your rash covered with a loose bandage (dressing). Wear loose-fitting clothing to help ease the pain of material rubbing against the rash.  Keep your rash and blisters clean by washing the area with mild soap and cool water as told by your health care provider.  Check your rash every day for  signs of infection. Check for: ? More redness, swelling, or pain. ? Fluid or blood. ? Warmth. ? Pus or a bad smell.  Do not scratch your rash or pick at your blisters. To help avoid scratching: ? Keep your fingernails clean and cut short. ? Wear gloves or mittens while you sleep, if scratching is a problem. General instructions  Rest as told by your health care  provider.  Keep all follow-up visits as told by your health care provider. This is important.  Wash your hands often with soap and water. If soap and water are not available, use hand sanitizer. Doing this lowers your chance of getting a bacterial skin infection.  Before your blisters change into scabs, your shingles infection can cause chickenpox in people who have never had it or have never been vaccinated against it. To prevent this from happening, avoid contact with other people, especially: ? Babies. ? Pregnant women. ? Children who have eczema. ? Elderly people who have transplants. ? People who have chronic illnesses, such as cancer or AIDS. Contact a health care provider if:  Your pain is not relieved with prescribed medicines.  Your pain does not get better after the rash heals.  You have signs of infection in the rash area, such as: ? More redness, swelling, or pain around the rash. ? Fluid or blood coming from the rash. ? The rash area feeling warm to the touch. ? Pus or a bad smell coming from the rash. Get help right away if:  The rash is on your face or nose.  You have facial pain, pain around your eye area, or loss of feeling on one side of your face.  You have difficulty seeing.  You have ear pain or have ringing in your ear.  You have a loss of taste.  Your condition gets worse. Summary  Shingles, which is also known as herpes zoster, is an infection that causes a painful skin rash and fluid-filled blisters.  This condition is diagnosed with a skin exam. Skin or fluid samples may be taken from the blisters and examined before the diagnosis is made.  Keep your rash covered with a loose bandage (dressing). Wear loose-fitting clothing to help ease the pain of material rubbing against the rash.  Before your blisters change into scabs, your shingles infection can cause chickenpox in people who have never had it or have never been vaccinated against it. This  information is not intended to replace advice given to you by your health care provider. Make sure you discuss any questions you have with your health care provider. Document Released: 01/08/2005 Document Revised: 05/02/2018 Document Reviewed: 09/12/2016 Elsevier Patient Education  2020 [employer].

## 2019-01-20 NOTE — Assessment & Plan Note (Signed)
Milder symptoms given past vaccine.  Treat with pain control, past window for antiviral.

## 2019-01-20 NOTE — Progress Notes (Signed)
Chief Complaint  Patient presents with  . Rash    Abdomen    History of Present Illness: HPI  63 year old female presents with new onset rash on abdomen.   5 days ago was outside after doing yard work. 1 day later noted rash on right mid abdomen. Not itchy, feels sore to touch, unilateral. Tingling in area.  Area has started scabbing up in areas,  No new lesions. No redness spreading   Feels slightly tired.  no fever.  Has had shingrix z 2  shot earlier this year.  Wt Readings from Last 3 Encounters:  01/20/19 139 lb 8 oz (63.3 kg)  01/09/19 138 lb 8 oz (62.8 kg)  03/07/18 178 lb 12 oz (81.1 kg)  Body mass index is 21.69 kg/m.   This visit occurred during the SARS-CoV-2 public health emergency.  Safety protocols were in place, including screening questions prior to the visit, additional usage of staff PPE, and extensive cleaning of exam room while observing appropriate contact time as indicated for disinfecting solutions.   COVID 19 screen:  No recent travel or known exposure to COVID19 The patient denies respiratory symptoms of COVID 19 at this time. The importance of social distancing was discussed today.     Review of Systems  Constitutional: Negative for chills and fever.  HENT: Negative for congestion and ear pain.   Eyes: Negative for pain and redness.  Respiratory: Negative for cough and shortness of breath.   Cardiovascular: Negative for chest pain, palpitations and leg swelling.  Gastrointestinal: Negative for abdominal pain, blood in stool, constipation, diarrhea, nausea and vomiting.  Genitourinary: Negative for dysuria.  Musculoskeletal: Negative for falls and myalgias.  Skin: Negative for rash.  Neurological: Negative for dizziness.  Psychiatric/Behavioral: Negative for depression. The patient is not nervous/anxious.       Past Medical History:  Diagnosis Date  . GERD (gastroesophageal reflux disease)   . Hyperlipidemia   . Hypertension   .  Hypothyroidism     reports that she has never smoked. She has never used smokeless tobacco. She reports that she does not drink alcohol or use drugs.   Current Outpatient Medications:  .  amLODipine (NORVASC) 10 MG tablet, TAKE 1 TABLET (10 MG TOTAL) BY MOUTH DAILY., Disp: 90 tablet, Rfl: 0 .  Calcium-Vitamin D-Vitamin K (CALCIUM SOFT CHEWS) 500-100-40 MG-UNT-MCG CHEW, Chew 2 tablets by mouth daily. , Disp: , Rfl:  .  levothyroxine (SYNTHROID, LEVOTHROID) 75 MCG tablet, TAKE 1 TABLET BY MOUTH DAILY., Disp: 90 tablet, Rfl: 3 .  Multiple Vitamin (MULTIVITAMINS PO), Take 1 tablet by mouth daily.  , Disp: , Rfl:  .  simvastatin (ZOCOR) 20 MG tablet, TAKE 1 TABLET BY MOUTH AT BEDTIME., Disp: 90 tablet, Rfl: 0   Observations/Objective: Blood pressure 124/62, pulse 64, temperature 98.5 F (36.9 C), temperature source Temporal, height 5' 7.25" (1.708 m), weight 139 lb 8 oz (63.3 kg), SpO2 98 %.  Physical Exam Constitutional:      General: She is not in acute distress.    Appearance: Normal appearance. She is well-developed. She is not ill-appearing or toxic-appearing.  HENT:     Head: Normocephalic.     Right Ear: Hearing, tympanic membrane, ear canal and external ear normal. Tympanic membrane is not erythematous, retracted or bulging.     Left Ear: Hearing, tympanic membrane, ear canal and external ear normal. Tympanic membrane is not erythematous, retracted or bulging.     Nose: No mucosal edema or rhinorrhea.  Right Sinus: No maxillary sinus tenderness or frontal sinus tenderness.     Left Sinus: No maxillary sinus tenderness or frontal sinus tenderness.     Mouth/Throat:     Pharynx: Uvula midline.  Eyes:     General: Lids are normal. Lids are everted, no foreign bodies appreciated.     Conjunctiva/sclera: Conjunctivae normal.     Pupils: Pupils are equal, round, and reactive to light.  Neck:     Thyroid: No thyroid mass or thyromegaly.     Vascular: No carotid bruit.     Trachea:  Trachea normal.  Cardiovascular:     Rate and Rhythm: Normal rate and regular rhythm.     Pulses: Normal pulses.     Heart sounds: Normal heart sounds, S1 normal and S2 normal. No murmur. No friction rub. No gallop.   Pulmonary:     Effort: Pulmonary effort is normal. No tachypnea or respiratory distress.     Breath sounds: Normal breath sounds. No decreased breath sounds, wheezing, rhonchi or rales.  Abdominal:     General: Bowel sounds are normal.     Palpations: Abdomen is soft.     Tenderness: There is no abdominal tenderness.  Musculoskeletal:     Cervical back: Normal range of motion and neck supple.  Skin:    General: Skin is warm and dry.     Findings: No rash.          Comments:  Scabbed areas in dermatomal region on right mid abdomen, does not cross midline.  Neurological:     Mental Status: She is alert.  Psychiatric:        Mood and Affect: Mood is not anxious or depressed.        Speech: Speech normal.        Behavior: Behavior normal. Behavior is cooperative.        Thought Content: Thought content normal.        Judgment: Judgment normal.      Assessment and Plan    Shingles Milder symptoms given past vaccine.  Treat with pain control, past window for antiviral.    Eliezer Lofts, MD

## 2019-01-26 ENCOUNTER — Other Ambulatory Visit: Payer: Self-pay | Admitting: Family Medicine

## 2019-02-03 ENCOUNTER — Other Ambulatory Visit: Payer: Self-pay | Admitting: Family Medicine

## 2019-04-17 ENCOUNTER — Ambulatory Visit: Payer: BC Managed Care – PPO | Attending: Internal Medicine

## 2019-04-17 DIAGNOSIS — Z23 Encounter for immunization: Secondary | ICD-10-CM

## 2019-04-17 NOTE — Progress Notes (Signed)
   Covid-19 Vaccination Clinic  Name:  Elizabeth Fitzpatrick    MRN: [id number] DOB: 1955-03-16  04/17/2019  [name] was observed post Covid-19 immunization for 15 minutes without incident. She was provided with Vaccine Information Sheet and instruction to access the V-Safe system.   [name] was instructed to call 911 with any severe reactions post vaccine: Marland Kitchen Difficulty breathing  . Swelling of face and throat  . A fast heartbeat  . A bad rash all over body  . Dizziness and weakness   Immunizations Administered    Name Date Dose VIS Date Route   Pfizer COVID-19 Vaccine 04/17/2019  1:17 PM 0.3 mL 01/02/2019 Intramuscular   Manufacturer: [place]   Lot: [id number]   [place]: [id number]

## 2019-05-12 ENCOUNTER — Ambulatory Visit: Payer: BC Managed Care – PPO | Attending: Internal Medicine

## 2019-05-12 DIAGNOSIS — Z23 Encounter for immunization: Secondary | ICD-10-CM

## 2019-05-12 NOTE — Progress Notes (Signed)
   Covid-19 Vaccination Clinic  Name:  Elizabeth Fitzpatrick    MRN: [id number] DOB: Jul 16, 1955  05/12/2019  [name] was observed post Covid-19 immunization for 30 minutes based on pre-vaccination screening without incident. She was provided with Vaccine Information Sheet and instruction to access the V-Safe system.   [name] was instructed to call 911 with any severe reactions post vaccine: Marland Kitchen Difficulty breathing  . Swelling of face and throat  . A fast heartbeat  . A bad rash all over body  . Dizziness and weakness   Immunizations Administered    Name Date Dose VIS Date Route   Pfizer COVID-19 Vaccine 05/12/2019  1:05 PM 0.3 mL 03/18/2018 Intramuscular   Manufacturer: [place]   Lot: [id number]   [place]: [id number]

## 2019-10-03 ENCOUNTER — Ambulatory Visit (INDEPENDENT_AMBULATORY_CARE_PROVIDER_SITE_OTHER): Payer: BC Managed Care – PPO

## 2019-10-03 ENCOUNTER — Other Ambulatory Visit: Payer: Self-pay

## 2019-10-03 DIAGNOSIS — Z23 Encounter for immunization: Secondary | ICD-10-CM

## 2019-10-23 DIAGNOSIS — R7309 Other abnormal glucose: Secondary | ICD-10-CM | POA: Diagnosis not present

## 2019-10-23 DIAGNOSIS — H2513 Age-related nuclear cataract, bilateral: Secondary | ICD-10-CM | POA: Diagnosis not present

## 2019-10-23 DIAGNOSIS — H00025 Hordeolum internum left lower eyelid: Secondary | ICD-10-CM | POA: Diagnosis not present

## 2019-10-23 DIAGNOSIS — H524 Presbyopia: Secondary | ICD-10-CM | POA: Diagnosis not present

## 2019-10-23 DIAGNOSIS — H35363 Drusen (degenerative) of macula, bilateral: Secondary | ICD-10-CM | POA: Diagnosis not present

## 2019-10-23 LAB — HM DIABETES EYE EXAM

## 2019-11-04 ENCOUNTER — Encounter: Payer: Self-pay | Admitting: Family Medicine

## 2019-12-28 ENCOUNTER — Telehealth: Payer: Self-pay | Admitting: Family Medicine

## 2019-12-28 DIAGNOSIS — Z87898 Personal history of other specified conditions: Secondary | ICD-10-CM

## 2019-12-28 DIAGNOSIS — E038 Other specified hypothyroidism: Secondary | ICD-10-CM

## 2019-12-28 DIAGNOSIS — E785 Hyperlipidemia, unspecified: Secondary | ICD-10-CM

## 2019-12-28 NOTE — Telephone Encounter (Signed)
-----   Message from Cloyd Stagers, RT sent at 12/23/2019  3:05 PM EST ----- Regarding: Lab Orders for Friday 12.17.2021 Please place lab orders for Friday 12.17.2021, office visit for physical on Tuesday 12.21.2021 Thank you, Dyke Maes RT(R)

## 2019-12-30 DIAGNOSIS — Z6823 Body mass index (BMI) 23.0-23.9, adult: Secondary | ICD-10-CM | POA: Diagnosis not present

## 2019-12-30 DIAGNOSIS — Z1231 Encounter for screening mammogram for malignant neoplasm of breast: Secondary | ICD-10-CM | POA: Diagnosis not present

## 2019-12-30 DIAGNOSIS — Z01419 Encounter for gynecological examination (general) (routine) without abnormal findings: Secondary | ICD-10-CM | POA: Diagnosis not present

## 2019-12-30 LAB — HM MAMMOGRAPHY

## 2020-01-06 ENCOUNTER — Other Ambulatory Visit: Payer: Self-pay

## 2020-01-06 ENCOUNTER — Other Ambulatory Visit (INDEPENDENT_AMBULATORY_CARE_PROVIDER_SITE_OTHER): Payer: BC Managed Care – PPO

## 2020-01-06 DIAGNOSIS — E785 Hyperlipidemia, unspecified: Secondary | ICD-10-CM

## 2020-01-06 DIAGNOSIS — Z87898 Personal history of other specified conditions: Secondary | ICD-10-CM

## 2020-01-06 DIAGNOSIS — E038 Other specified hypothyroidism: Secondary | ICD-10-CM

## 2020-01-06 LAB — COMPREHENSIVE METABOLIC PANEL
ALT: 23 U/L (ref 0–35)
AST: 26 U/L (ref 0–37)
Albumin: 4.7 g/dL (ref 3.5–5.2)
Alkaline Phosphatase: 70 U/L (ref 39–117)
BUN: 15 mg/dL (ref 6–23)
CO2: 33 mEq/L — ABNORMAL HIGH (ref 19–32)
Calcium: 10 mg/dL (ref 8.4–10.5)
Chloride: 102 mEq/L (ref 96–112)
Creatinine, Ser: 0.74 mg/dL (ref 0.40–1.20)
GFR: 86.34 mL/min (ref >60.00)
Glucose, Bld: 75 mg/dL (ref 70–99)
Potassium: 3.8 mEq/L (ref 3.5–5.1)
Sodium: 140 mEq/L (ref 135–145)
Total Bilirubin: 1.2 mg/dL (ref 0.2–1.2)
Total Protein: 7.1 g/dL (ref 6.0–8.3)

## 2020-01-06 LAB — TSH: TSH: 2.2 u[IU]/mL (ref 0.35–4.50)

## 2020-01-06 LAB — T3, FREE: T3, Free: 3 pg/mL (ref 2.3–4.2)

## 2020-01-06 LAB — LIPID PANEL
Cholesterol: 218 mg/dL — ABNORMAL HIGH (ref 0–200)
HDL: 83.1 mg/dL (ref >39.00)
LDL Cholesterol: 117 mg/dL — ABNORMAL HIGH (ref 0–99)
NonHDL: 134.62
Total CHOL/HDL Ratio: 3
Triglycerides: 86 mg/dL (ref 0.0–149.0)
VLDL: 17.2 mg/dL (ref 0.0–40.0)

## 2020-01-06 LAB — T4, FREE: Free T4: 0.86 ng/dL (ref 0.60–1.60)

## 2020-01-06 LAB — HEMOGLOBIN A1C: Hgb A1c MFr Bld: 5.3 % (ref 4.6–6.5)

## 2020-01-08 ENCOUNTER — Other Ambulatory Visit: Payer: BC Managed Care – PPO

## 2020-01-08 NOTE — Progress Notes (Signed)
No critical labs need to be addressed urgently. We will discuss labs in detail at upcoming office visit.   

## 2020-01-11 ENCOUNTER — Encounter: Payer: Self-pay | Admitting: Family Medicine

## 2020-01-12 ENCOUNTER — Encounter: Payer: Self-pay | Admitting: Family Medicine

## 2020-01-12 ENCOUNTER — Other Ambulatory Visit: Payer: Self-pay

## 2020-01-12 ENCOUNTER — Ambulatory Visit (INDEPENDENT_AMBULATORY_CARE_PROVIDER_SITE_OTHER): Payer: BC Managed Care – PPO | Admitting: Family Medicine

## 2020-01-12 VITALS — BP 120/70 | HR 57 | Temp 97.2°F | Ht 67.0 in | Wt 153.1 lb

## 2020-01-12 DIAGNOSIS — Z0000 Encounter for general adult medical examination without abnormal findings: Secondary | ICD-10-CM

## 2020-01-12 DIAGNOSIS — E038 Other specified hypothyroidism: Secondary | ICD-10-CM

## 2020-01-12 DIAGNOSIS — E785 Hyperlipidemia, unspecified: Secondary | ICD-10-CM | POA: Diagnosis not present

## 2020-01-12 DIAGNOSIS — Z1211 Encounter for screening for malignant neoplasm of colon: Secondary | ICD-10-CM

## 2020-01-12 DIAGNOSIS — I10 Essential (primary) hypertension: Secondary | ICD-10-CM

## 2020-01-12 MED ORDER — AMLODIPINE BESYLATE 10 MG PO TABS: 10.0000 mg | ORAL_TABLET | Freq: Every day | ORAL | 3 refills | Status: DC

## 2020-01-12 MED ORDER — SIMVASTATIN 20 MG PO TABS: 20.0000 mg | ORAL_TABLET | Freq: Every day | ORAL | 3 refills | Status: DC

## 2020-01-12 MED ORDER — LEVOTHYROXINE SODIUM 75 MCG PO TABS: 75.0000 ug | ORAL_TABLET | Freq: Every day | ORAL | 3 refills | Status: DC

## 2020-01-12 NOTE — Patient Instructions (Addendum)
Work on low cholesterol diet without weight loss.  Keep up with the regular exercise.  Pick up stool testing  lab on way out.

## 2020-01-12 NOTE — Progress Notes (Signed)
Patient ID: Elizabeth Fitzpatrick, female    DOB: Feb 24, 1955, 64 y.o.   MRN: [id number]  This visit was conducted in person.  BP 120/70 (BP Location: Left Arm, Patient Position: Sitting)   Pulse (!) 57   Temp (!) 97.2 F (36.2 C) (Temporal)   Ht 5\' 7"  (1.702 m)   Wt 153 lb 1.9 oz (69.5 kg)   SpO2 97%   BMI 23.98 kg/m    CC:  Annual exam Subjective:   HPI: Elizabeth Fitzpatrick is a 64 y.o. female presenting on 01/12/2020 for Annual Exam  Doing well overall.  Elevated Cholesterol: LDL at goal simvastatin Using medications without problems: Muscle aches:  Diet compliance: Exercise:  Walking daily Other complaints:  BMI < 25 The 10-year ASCVD risk score Mikey Bussing DC Jr., et al., 2013) is: 4.5%   Values used to calculate the score:     Age: 38 years     Sex: Female     Is Non-Hispanic African American: No     Diabetic: No     Tobacco smoker: No     Systolic Blood Pressure: 408 mmHg     Is BP treated: Yes     HDL Cholesterol: 83.1 mg/dL     Total Cholesterol: 218 mg/dL  Hypertension:   At goal on amlodipine 10 mg daily BP Readings from Last 3 Encounters:  01/12/20 120/70  01/20/19 124/62  01/09/19 112/70  Using medication without problems or lightheadedness:  none Chest pain with exertion: none Edema:none Short of breath: none Average home BPs: not checking Other issues:   Hypothyroid:   At goal TSH and free 3/4 on levothyroxine 75 mcg    Relevant past medical, surgical, family and social history reviewed and updated as indicated. Interim medical history since our last visit reviewed. Allergies and medications reviewed and updated. Outpatient Medications Prior to Visit  Medication Sig Dispense Refill  . amLODipine (NORVASC) 10 MG tablet TAKE 1 TABLET BY MOUTH DAILY. 90 tablet 3  . Calcium-Vitamin D-Vitamin K 500-100-40 MG-UNT-MCG CHEW Chew 2 tablets by mouth daily.    Marland Kitchen levothyroxine (SYNTHROID) 75 MCG tablet TAKE 1 TABLET BY MOUTH DAILY. 90 tablet 3  . Multiple Vitamin  (MULTIVITAMINS PO) Take 1 tablet by mouth daily.    . simvastatin (ZOCOR) 20 MG tablet TAKE 1 TABLET BY MOUTH AT BEDTIME. 90 tablet 3   No facility-administered medications prior to visit.     Per HPI unless specifically indicated in ROS section below Review of Systems  Constitutional: Negative for fatigue and fever.  HENT: Negative for congestion.   Eyes: Negative for pain.  Respiratory: Negative for cough and shortness of breath.   Cardiovascular: Negative for chest pain, palpitations and leg swelling.  Gastrointestinal: Negative for abdominal pain.  Genitourinary: Negative for dysuria and vaginal bleeding.  Musculoskeletal: Negative for back pain.  Neurological: Negative for syncope, light-headedness and headaches.  Psychiatric/Behavioral: Negative for dysphoric mood.   Objective:  BP 120/70 (BP Location: Left Arm, Patient Position: Sitting)   Pulse (!) 57   Temp (!) 97.2 F (36.2 C) (Temporal)   Ht 5\' 7"  (1.702 m)   Wt 153 lb 1.9 oz (69.5 kg)   SpO2 97%   BMI 23.98 kg/m   Wt Readings from Last 3 Encounters:  01/12/20 153 lb 1.9 oz (69.5 kg)  01/20/19 139 lb 8 oz (63.3 kg)  01/09/19 138 lb 8 oz (62.8 kg)      Physical Exam Constitutional:      General:  She is not in acute distress.Vital signs are normal.     Appearance: Normal appearance. She is well-developed and well-nourished. She is not ill-appearing or toxic-appearing.  HENT:     Head: Normocephalic.     Right Ear: Hearing, tympanic membrane, ear canal and external ear normal.     Left Ear: Hearing, tympanic membrane, ear canal and external ear normal.     Nose: Nose normal.  Eyes:     General: Lids are normal. Lids are everted, no foreign bodies appreciated.     Extraocular Movements: EOM normal.     Conjunctiva/sclera: Conjunctivae normal.     Pupils: Pupils are equal, round, and reactive to light.  Neck:     Thyroid: No thyroid mass or thyromegaly.     Vascular: No carotid bruit.     Trachea: Trachea  normal.  Cardiovascular:     Rate and Rhythm: Normal rate and regular rhythm.     Pulses: Intact distal pulses.     Heart sounds: Normal heart sounds, S1 normal and S2 normal. No murmur heard. No gallop.   Pulmonary:     Effort: Pulmonary effort is normal. No respiratory distress.     Breath sounds: Normal breath sounds. No wheezing, rhonchi or rales.  Abdominal:     General: Bowel sounds are normal. There is no distension or abdominal bruit.     Palpations: Abdomen is soft. There is no fluid wave, hepatosplenomegaly or mass.     Tenderness: There is no abdominal tenderness. There is no CVA tenderness, guarding or rebound.     Hernia: No hernia is present.  Musculoskeletal:     Cervical back: Normal range of motion and neck supple.  Lymphadenopathy:     Cervical: No cervical adenopathy.     Upper Body:  No axillary adenopathy present. Skin:    General: Skin is warm, dry and intact.     Findings: No rash.  Neurological:     Mental Status: She is alert.     Cranial Nerves: No cranial nerve deficit.     Sensory: No sensory deficit.     Deep Tendon Reflexes: Strength normal.  Psychiatric:        Mood and Affect: Mood is not anxious or depressed.        Speech: Speech normal.        Behavior: Behavior normal. Behavior is cooperative.        Cognition and Memory: Cognition and memory normal.        Judgment: Judgment normal.       Results for orders placed or performed in visit on 01/11/20  [facility] MAMMOGRAPHY  Result Value Ref Range   HM Mammogram 0-4 Bi-Rad 0-4 Bi-Rad, Self Reported Normal    This visit occurred during the SARS-CoV-2 public health emergency.  Safety protocols were in place, including screening questions prior to the visit, additional usage of staff PPE, and extensive cleaning of exam room while observing appropriate contact time as indicated for disinfecting solutions.   COVID 19 screen:  No recent travel or known exposure to COVID19 The patient denies  respiratory symptoms of COVID 19 at this time. The importance of social distancing was discussed today.   Assessment and Plan The patient's preventative maintenance and recommended screening tests for an annual wellness exam were reviewed in full today. Brought up to date unless services declined.  Counselled on the importance of diet, exercise, and its role in overall health and mortality. The patient's FH and SH was  reviewed, including their home life, tobacco status, and drug and alcohol status.   Vaccines:flu, COVID x 3 and shingles uptodate. Pap/DVE:Per GYN last 2019 Mammo:12/30/2019 Bone Density: normal 2020, repeat in 2025 Colon:Dr. Amedeo Plenty, nml... Plans iFOB again today.. Eventually will plan repeat colon. Smoking Status:none ETOH/ drug KHT:XHFS/FSEL Hep C:done   Problem List Items Addressed This Visit    Hyperlipidemia    LDL at goal simvastatin 20 mg daily      Relevant Medications   amLODipine (NORVASC) 10 MG tablet   simvastatin (ZOCOR) 20 MG tablet   Hypertension    Stable, chronic.  Continue current medication.  Amlodipine 10 mg daily       Relevant Medications   amLODipine (NORVASC) 10 MG tablet   simvastatin (ZOCOR) 20 MG tablet   Hypothyroidism    Stable, chronic.  Continue current medication.   levothyroxine 75 mcg daily      Relevant Medications   levothyroxine (SYNTHROID) 75 MCG tablet   Routine general medical examination at a health care facility - Primary    Other Visit Diagnoses    Essential hypertension       Relevant Medications   amLODipine (NORVASC) 10 MG tablet   simvastatin (ZOCOR) 20 MG tablet   Colon cancer screening       Relevant Orders   Fecal occult blood, imunochemical (Completed)     Orders Placed This Encounter  Procedures  . Fecal occult blood, imunochemical    Standing Status:   Future    Number of Occurrences:   1    Standing Expiration Date:   01/11/2021    Eliezer Lofts, MD

## 2020-01-28 ENCOUNTER — Telehealth: Payer: Self-pay | Admitting: Radiology

## 2020-01-28 ENCOUNTER — Other Ambulatory Visit (INDEPENDENT_AMBULATORY_CARE_PROVIDER_SITE_OTHER): Payer: BC Managed Care – PPO

## 2020-01-28 DIAGNOSIS — Z1211 Encounter for screening for malignant neoplasm of colon: Secondary | ICD-10-CM

## 2020-01-28 LAB — FECAL OCCULT BLOOD, IMMUNOCHEMICAL: Fecal Occult Bld: POSITIVE — AB

## 2020-01-28 NOTE — Telephone Encounter (Signed)
Sent message to patient via MyChart.  

## 2020-01-28 NOTE — Telephone Encounter (Signed)
[facility] called a POSITIVE ifob, Results given to Dr Ermalene Searing

## 2020-02-03 ENCOUNTER — Other Ambulatory Visit: Payer: Self-pay | Admitting: Family Medicine

## 2020-02-03 DIAGNOSIS — Z1211 Encounter for screening for malignant neoplasm of colon: Secondary | ICD-10-CM

## 2020-03-03 ENCOUNTER — Encounter: Payer: Self-pay | Admitting: Gastroenterology

## 2020-03-16 NOTE — Assessment & Plan Note (Signed)
LDL at goal simvastatin 20 mg daily

## 2020-03-16 NOTE — Assessment & Plan Note (Signed)
Stable, chronic.  Continue current medication.  Amlodipine 10 mg daily 

## 2020-03-16 NOTE — Assessment & Plan Note (Signed)
Stable, chronic.  Continue current medication.    levothyroxine 75 mcg daily 

## 2020-04-12 ENCOUNTER — Other Ambulatory Visit: Payer: Self-pay

## 2020-04-12 ENCOUNTER — Ambulatory Visit (AMBULATORY_SURGERY_CENTER): Payer: BC Managed Care – PPO | Admitting: *Deleted

## 2020-04-12 VITALS — Ht 67.0 in | Wt 165.0 lb

## 2020-04-12 DIAGNOSIS — Z1211 Encounter for screening for malignant neoplasm of colon: Secondary | ICD-10-CM

## 2020-04-12 MED ORDER — NA SULFATE-K SULFATE-MG SULF 17.5-3.13-1.6 GM/177ML PO SOLN: 1.0000 | Freq: Once | ORAL | 0 refills | Status: AC

## 2020-04-12 NOTE — Progress Notes (Signed)

## 2020-04-15 ENCOUNTER — Encounter: Payer: Self-pay | Admitting: Gastroenterology

## 2020-04-25 ENCOUNTER — Other Ambulatory Visit: Payer: Self-pay

## 2020-04-25 ENCOUNTER — Ambulatory Visit (AMBULATORY_SURGERY_CENTER): Payer: BC Managed Care – PPO | Admitting: Gastroenterology

## 2020-04-25 ENCOUNTER — Encounter: Payer: Self-pay | Admitting: Gastroenterology

## 2020-04-25 VITALS — BP 134/69 | HR 64 | Temp 96.5°F | Resp 19 | Ht 67.0 in | Wt 165.0 lb

## 2020-04-25 DIAGNOSIS — Z1211 Encounter for screening for malignant neoplasm of colon: Secondary | ICD-10-CM

## 2020-04-25 DIAGNOSIS — D124 Benign neoplasm of descending colon: Secondary | ICD-10-CM

## 2020-04-25 MED ORDER — SODIUM CHLORIDE 0.9 % IV SOLN
500.0000 mL | INTRAVENOUS | Status: DC
Start: 2020-04-25 — End: 2020-04-25

## 2020-04-25 NOTE — Progress Notes (Signed)
A/ox3, pleased with MAC, report to RN 

## 2020-04-25 NOTE — Progress Notes (Signed)
Called to room to assist during endoscopic procedure.  Patient ID and intended procedure confirmed with present staff. Received instructions for my participation in the procedure from the performing physician.  

## 2020-04-25 NOTE — Op Note (Signed)
[place] Patient Name: Elizabeth Fitzpatrick Procedure Date: 04/25/2020 9:13 AM MRN: [id number] Endoscopist: Mauri Pole , MD Age: 65 Referring MD:  Date of Birth: 1955/11/06 Gender: Female Account #: [id number] Procedure:                Colonoscopy Indications:              Screening for colorectal malignant neoplasm Medicines:                Monitored Anesthesia Care Procedure:                Pre-Anesthesia Assessment:                           - Prior to the procedure, a History and Physical                            was performed, and patient medications and                            allergies were reviewed. The patient's tolerance of                            previous anesthesia was also reviewed. The risks                            and benefits of the procedure and the sedation                            options and risks were discussed with the patient.                            All questions were answered, and informed consent                            was obtained. Prior Anticoagulants: The patient has                            taken no previous anticoagulant or antiplatelet                            agents. ASA Grade Assessment: II - A patient with                            mild systemic disease. After reviewing the risks                            and benefits, the patient was deemed in                            satisfactory condition to undergo the procedure.                           After obtaining informed consent, the colonoscope  was passed under direct vision. Throughout the                            procedure, the patient's blood pressure, pulse, and                            oxygen saturations were monitored continuously. The                            Olympus PCF-H190DL (#[id number]) Colonoscope was                            introduced through the anus and advanced to the the                            cecum,  identified by appendiceal orifice and                            ileocecal valve. The colonoscopy was performed                            without difficulty. The patient tolerated the                            procedure well. The quality of the bowel                            preparation was good. The ileocecal valve,                            appendiceal orifice, and rectum were photographed. Scope In: 9:18:38 AM Scope Out: 9:37:30 AM Scope Withdrawal Time: 0 hours 11 minutes 8 seconds  Total Procedure Duration: 0 hours 18 minutes 52 seconds  Findings:                 The perianal and digital rectal examinations were                            normal.                           A 12 mm polyp was found in the descending colon.                            The polyp was pedunculated. The polyp was removed                            with a hot snare. Resection and retrieval were                            complete.                           Scattered small and large-mouthed diverticula were  found in the sigmoid colon, descending colon,                            transverse colon and ascending colon. There was                            evidence of an impacted diverticulum.                           Non-bleeding external and internal hemorrhoids were                            found during retroflexion. The hemorrhoids were                            medium-sized. Complications:            No immediate complications. Estimated Blood Loss:     Estimated blood loss was minimal. Impression:               - One 12 mm polyp in the descending colon, removed                            with a hot snare. Resected and retrieved.                           - Moderate diverticulosis in the sigmoid colon, in                            the descending colon, in the transverse colon and                            in the ascending colon. There was evidence of an                             impacted diverticulum.                           - Non-bleeding external and internal hemorrhoids. Recommendation:           - Patient has a contact number available for                            emergencies. The signs and symptoms of potential                            delayed complications were discussed with the                            patient. Return to normal activities tomorrow.                            Written discharge instructions were provided to the                            patient.                           -  Resume previous diet.                           - Continue present medications.                           - Await pathology results.                           - Repeat colonoscopy in 3 years for surveillance                            based on pathology results.                           - No ibuprofen, naproxen, or other non-steroidal                            anti-inflammatory drugs for 2 weeks. Mauri Pole, MD 04/25/2020 9:42:43 AM This report has been signed electronically.

## 2020-04-25 NOTE — Progress Notes (Signed)
Pt is HIPPA.  Dr. Silverio Decamp and I went over the finding and discharge information with pt only.  AVS, Report and info on polyps, diverticulosis, hemorrhoids and hemorrhoid banding.    No problems noted in the recovery room. maw

## 2020-04-25 NOTE — Patient Instructions (Addendum)
Handouts were given to your care partner on polyps, diverticulosis, hemorrhoids, and hemorrhoid banding info. NO ASPIRIN, ASPIRIN CONTAINING PRODUCTS (BC OR GOODY POWDERS) OR NSAIDS (IBUPROFEN, ADVIL, ALEVE, AND MOTRIN) FOR 2 weeks;  TYLENOL IS OK TO TAKE You may resume your other current medications today. Await biopsy results.  May take 1-3 weeks to receive pathology results.   Please call the office if you are interested in setting up an appointment to have your hemorrhoids banded.  [phone] Please call if any questions or concerns.     YOU HAD AN ENDOSCOPIC PROCEDURE TODAY AT [facility]:   Refer to the procedure report that was given to you for any specific questions about what was found during the examination.  If the procedure report does not answer your questions, please call your gastroenterologist to clarify.  If you requested that your care partner not be given the details of your procedure findings, then the procedure report has been included in a sealed envelope for you to review at your convenience later.  YOU SHOULD EXPECT: Some feelings of bloating in the abdomen. Passage of more gas than usual.  Walking can help get rid of the air that was put into your GI tract during the procedure and reduce the bloating. If you had a lower endoscopy (such as a colonoscopy or flexible sigmoidoscopy) you may notice spotting of blood in your stool or on the toilet paper. If you underwent a bowel prep for your procedure, you may not have a normal bowel movement for a few days.  Please Note:  You might notice some irritation and congestion in your nose or some drainage.  This is from the oxygen used during your procedure.  There is no need for concern and it should clear up in a day or so.  SYMPTOMS TO REPORT IMMEDIATELY:   Following lower endoscopy (colonoscopy or flexible sigmoidoscopy):  Excessive amounts of blood in the stool  Significant tenderness or worsening of  abdominal pains  Swelling of the abdomen that is new, acute  Fever of 100F or higher   For urgent or emergent issues, a gastroenterologist can be reached at any hour by calling [phone]. Do not use MyChart messaging for urgent concerns.    DIET:  We do recommend a small meal at first, but then you may proceed to your regular diet.  Drink plenty of fluids but you should avoid alcoholic beverages for 24 hours.  ACTIVITY:  You should plan to take it easy for the rest of today and you should NOT DRIVE or use heavy machinery until tomorrow (because of the sedation medicines used during the test).    FOLLOW UP: Our staff will call the number listed on your records 48-72 hours following your procedure to check on you and address any questions or concerns that you may have regarding the information given to you following your procedure. If we do not reach you, we will leave a message.  We will attempt to reach you two times.  During this call, we will ask if you have developed any symptoms of COVID 19. If you develop any symptoms (ie: fever, flu-like symptoms, shortness of breath, cough etc.) before then, please call [phone].  If you test positive for Covid 19 in the 2 weeks post procedure, please call and report this information to [place].    If any biopsies were taken you will be contacted by phone or by letter within the next 1-3 weeks.  Please call  us at [phone] if you have not heard about the biopsies in 3 weeks.    SIGNATURES/CONFIDENTIALITY: You and/or your care partner have signed paperwork which will be entered into your electronic medical record.  These signatures attest to the fact that that the information above on your After Visit Summary has been reviewed and is understood.  Full responsibility of the confidentiality of this discharge information lies with you and/or your care-partner.

## 2020-04-26 ENCOUNTER — Encounter: Payer: Self-pay | Admitting: Gastroenterology

## 2020-04-27 ENCOUNTER — Telehealth: Payer: Self-pay

## 2020-04-27 NOTE — Telephone Encounter (Signed)
  Follow up Call-  Call back number 04/25/2020  Post procedure Call Back phone  # [phone]  Permission to leave phone message Yes  Some recent data might be hidden     Patient questions:  Do you have a fever, pain , or abdominal swelling? No. Pain Score  0 *  Have you tolerated food without any problems? Yes.    Have you been able to return to your normal activities? Yes.    Do you have any questions about your discharge instructions: Diet   No. Medications  No. Follow up visit  No.  Do you have questions or concerns about your Care? No.  Actions: * If pain score is 4 or above: No action needed, pain <4.  1. Have you developed a fever since your procedure? no  2.   Have you had an respiratory symptoms (SOB or cough) since your procedure? no  3.   Have you tested positive for COVID 19 since your procedure no  4.   Have you had any family members/close contacts diagnosed with the COVID 19 since your procedure?  No  Pt asked when she should call to set up an appointment to band hemorrhoids.  I advised pt that she was to hold ibuprofen, naproxen, or other NSAIDs drugs for 2 week.  I advised pt to call to set up an office appointment for banding.  maw   If yes to any of these questions please route to Joylene John, RN and Joella Prince, RN

## 2020-05-17 ENCOUNTER — Encounter: Payer: Self-pay | Admitting: Gastroenterology

## 2020-07-16 ENCOUNTER — Other Ambulatory Visit: Payer: Self-pay | Admitting: Family Medicine

## 2020-11-18 ENCOUNTER — Other Ambulatory Visit: Payer: Self-pay

## 2020-11-18 ENCOUNTER — Other Ambulatory Visit (HOSPITAL_BASED_OUTPATIENT_CLINIC_OR_DEPARTMENT_OTHER): Payer: Self-pay

## 2020-11-18 ENCOUNTER — Ambulatory Visit: Payer: BC Managed Care – PPO | Attending: Internal Medicine

## 2020-11-18 DIAGNOSIS — Z23 Encounter for immunization: Secondary | ICD-10-CM

## 2020-11-18 MED ORDER — PFIZER COVID-19 VAC BIVALENT 30 MCG/0.3ML IM SUSP
INTRAMUSCULAR | 0 refills | Status: DC
  Filled 2020-11-18: qty 0.3, 1d supply, fill #0

## 2020-11-18 NOTE — Progress Notes (Signed)
   Covid-19 Vaccination Clinic  Name:  Elizabeth Fitzpatrick    MRN: [id number] DOB: Nov 07, 1955  11/18/2020  [name] was observed post Covid-19 immunization for 15 minutes without incident. She was provided with Vaccine Information Sheet and instruction to access the V-Safe system.   [name] was instructed to call 911 with any severe reactions post vaccine: Difficulty breathing  Swelling of face and throat  A fast heartbeat  A bad rash all over body  Dizziness and weakness   Immunizations Administered     Name Date Dose VIS Date Route   Pfizer Covid-19 Vaccine Bivalent Booster 11/18/2020 12:38 PM 0.3 mL 09/21/2020 Intramuscular   Manufacturer: [employer]   Lot: [id number]   [place]: [phone]

## 2021-01-02 ENCOUNTER — Other Ambulatory Visit: Payer: BC Managed Care – PPO

## 2021-01-03 ENCOUNTER — Telehealth: Payer: Self-pay | Admitting: Family Medicine

## 2021-01-03 DIAGNOSIS — E785 Hyperlipidemia, unspecified: Secondary | ICD-10-CM

## 2021-01-03 DIAGNOSIS — I10 Essential (primary) hypertension: Secondary | ICD-10-CM

## 2021-01-03 DIAGNOSIS — E038 Other specified hypothyroidism: Secondary | ICD-10-CM

## 2021-01-03 DIAGNOSIS — Z87898 Personal history of other specified conditions: Secondary | ICD-10-CM

## 2021-01-03 NOTE — Telephone Encounter (Signed)
-----   Message from Ellamae Sia sent at 12/20/2020 10:11 AM EST ----- Regarding: Lab orders for Wednesday, 12.14.22 Patient is scheduled for CPX labs, please order future labs, Thanks , Karna Christmas

## 2021-01-04 ENCOUNTER — Other Ambulatory Visit: Payer: BC Managed Care – PPO

## 2021-01-05 DIAGNOSIS — H04123 Dry eye syndrome of bilateral lacrimal glands: Secondary | ICD-10-CM | POA: Diagnosis not present

## 2021-01-05 DIAGNOSIS — H25813 Combined forms of age-related cataract, bilateral: Secondary | ICD-10-CM | POA: Diagnosis not present

## 2021-01-05 DIAGNOSIS — H524 Presbyopia: Secondary | ICD-10-CM | POA: Diagnosis not present

## 2021-01-05 DIAGNOSIS — H35363 Drusen (degenerative) of macula, bilateral: Secondary | ICD-10-CM | POA: Diagnosis not present

## 2021-01-05 DIAGNOSIS — R7309 Other abnormal glucose: Secondary | ICD-10-CM | POA: Diagnosis not present

## 2021-01-05 LAB — HM DIABETES EYE EXAM

## 2021-01-13 ENCOUNTER — Ambulatory Visit (INDEPENDENT_AMBULATORY_CARE_PROVIDER_SITE_OTHER): Payer: BC Managed Care – PPO | Admitting: Family Medicine

## 2021-01-13 ENCOUNTER — Other Ambulatory Visit: Payer: Self-pay

## 2021-01-13 ENCOUNTER — Encounter: Payer: Self-pay | Admitting: Family Medicine

## 2021-01-13 VITALS — BP 132/74 | HR 71 | Temp 97.7°F | Ht 66.75 in | Wt 182.2 lb

## 2021-01-13 DIAGNOSIS — E038 Other specified hypothyroidism: Secondary | ICD-10-CM

## 2021-01-13 DIAGNOSIS — Z23 Encounter for immunization: Secondary | ICD-10-CM | POA: Diagnosis not present

## 2021-01-13 DIAGNOSIS — Z0000 Encounter for general adult medical examination without abnormal findings: Secondary | ICD-10-CM

## 2021-01-13 DIAGNOSIS — Z87898 Personal history of other specified conditions: Secondary | ICD-10-CM | POA: Diagnosis not present

## 2021-01-13 DIAGNOSIS — I10 Essential (primary) hypertension: Secondary | ICD-10-CM

## 2021-01-13 DIAGNOSIS — E785 Hyperlipidemia, unspecified: Secondary | ICD-10-CM | POA: Diagnosis not present

## 2021-01-13 LAB — LIPID PANEL
Cholesterol: 231 mg/dL — ABNORMAL HIGH (ref 0–200)
HDL: 65.8 mg/dL (ref >39.00)
LDL Cholesterol: 140 mg/dL — ABNORMAL HIGH (ref 0–99)
NonHDL: 164.98
Total CHOL/HDL Ratio: 4
Triglycerides: 124 mg/dL (ref 0.0–149.0)
VLDL: 24.8 mg/dL (ref 0.0–40.0)

## 2021-01-13 LAB — HEMOGLOBIN A1C: Hgb A1c MFr Bld: 5.7 % (ref 4.6–6.5)

## 2021-01-13 LAB — COMPREHENSIVE METABOLIC PANEL
ALT: 19 U/L (ref 0–35)
AST: 23 U/L (ref 0–37)
Albumin: 4.7 g/dL (ref 3.5–5.2)
Alkaline Phosphatase: 76 U/L (ref 39–117)
BUN: 13 mg/dL (ref 6–23)
CO2: 33 mEq/L — ABNORMAL HIGH (ref 19–32)
Calcium: 10.2 mg/dL (ref 8.4–10.5)
Chloride: 103 mEq/L (ref 96–112)
Creatinine, Ser: 0.81 mg/dL (ref 0.40–1.20)
GFR: 76.92 mL/min (ref >60.00)
Glucose, Bld: 86 mg/dL (ref 70–99)
Potassium: 3.9 mEq/L (ref 3.5–5.1)
Sodium: 141 mEq/L (ref 135–145)
Total Bilirubin: 1 mg/dL (ref 0.2–1.2)
Total Protein: 7.1 g/dL (ref 6.0–8.3)

## 2021-01-13 LAB — TSH: TSH: 3.42 u[IU]/mL (ref 0.35–5.50)

## 2021-01-13 NOTE — Progress Notes (Signed)
Patient ID: Elizabeth Fitzpatrick, female    DOB: [birth year], 65 y.o.   MRN: [id number]  This visit was conducted in person.  BP 132/74    Pulse 71    Temp 97.7 F (36.5 C) (Temporal)    Ht 5' 6.75" (1.695 m)    Wt 182 lb 4 oz (82.7 kg)    SpO2 98%    BMI 28.76 kg/m    CC: Chief Complaint  Patient presents with   Annual Exam    Subjective:   HPI: Elizabeth Fitzpatrick is a 65 y.o. female presenting on 01/13/2021 for Annual Exam  She has had increased stress overall this past year, helping to are for sister.  She has not bee keeping up with healthy lifestyle.   [place]  PHQ-2 Total Score 0        Hypertension:  Well controlled on amlodipine 10 mg daily BP Readings from Last 3 Encounters:  01/13/21 132/74  04/25/20 134/69  01/12/20 120/70  Using medication without problems or lightheadedness:  none Chest pain with exertion:none Edema:none Short of breath: none Average home BPs: Other issues:   Due for re-eval of A1C, lipids panel and CMET  Compliant with simvastatin 20 mg daily most of the time but has missed some doses in last 3 months.   Diet:  moderate Exercise: she has been very active caring  for sister.   Due for re-eval of hypothyroid.Marland Kitchen on levothyroxine 75 mcg daily     Relevant past medical, surgical, family and social history reviewed and updated as indicated. Interim medical history since our last visit reviewed. Allergies and medications reviewed and updated. Outpatient Medications Prior to Visit  Medication Sig Dispense Refill   amLODipine (NORVASC) 10 MG tablet Take 1 tablet (10 mg total) by mouth daily. 90 tablet 3   Calcium-Vitamin D-Vitamin K 500-100-40 MG-UNT-MCG CHEW Chew 2 tablets by mouth daily.     levothyroxine (SYNTHROID) 75 MCG tablet Take 1 tablet (75 mcg total) by mouth daily. 90 tablet 3   Multiple Vitamin (MULTIVITAMINS PO) Take 1 tablet by mouth daily.      simvastatin (ZOCOR) 20 MG tablet Take 1 tablet (20 mg total) by mouth at bedtime. 90 tablet 3   COVID-19 mRNA bivalent vaccine, Pfizer, (PFIZER COVID-19 VAC BIVALENT) injection Inject into the muscle. 0.3 mL 0   No facility-administered medications prior to visit.     Per HPI unless specifically indicated in ROS section below Review of Systems  Constitutional:  Negative for fatigue and fever.  HENT:  Negative for congestion.   Eyes:  Negative for pain.  Respiratory:  Negative for cough and shortness of breath.   Cardiovascular:  Negative for chest pain, palpitations and leg swelling.  Gastrointestinal:  Negative for abdominal pain.  Genitourinary:  Negative for dysuria and vaginal bleeding.  Musculoskeletal:  Negative for back pain.  Neurological:  Negative for syncope, light-headedness and headaches.  Psychiatric/Behavioral:  Negative for dysphoric mood.   Objective:  BP 132/74    Pulse 71    Temp 97.7 F (36.5 C) (Temporal)    Ht 5' 6.75" (1.695 m)    Wt 182 lb 4 oz (82.7 kg)    SpO2 98%    BMI 28.76 kg/m   Wt Readings from Last 3 Encounters:  01/13/21 182 lb 4 oz (82.7 kg)  04/25/20 165 lb (74.8 kg)  04/12/20 165 lb (74.8 kg)  Physical Exam Vitals and nursing note reviewed.  Constitutional:      General: She is not in acute distress.    Appearance: Normal appearance. She is well-developed. She is not ill-appearing or toxic-appearing.  HENT:     Head: Normocephalic.     Right Ear: Hearing, tympanic membrane, ear canal and external ear normal.     Left Ear: Hearing, tympanic membrane, ear canal and external ear normal.     Nose: Nose normal.  Eyes:     General: Lids are normal. Lids are everted, no foreign bodies appreciated.     Conjunctiva/sclera: Conjunctivae normal.     Pupils: Pupils are equal, round, and reactive to light.  Neck:     Thyroid: No thyroid mass or thyromegaly.     Vascular: No carotid bruit.     Trachea: Trachea normal.  Cardiovascular:     Rate  and Rhythm: Normal rate and regular rhythm.     Heart sounds: Normal heart sounds, S1 normal and S2 normal. No murmur heard.   No gallop.  Pulmonary:     Effort: Pulmonary effort is normal. No respiratory distress.     Breath sounds: Normal breath sounds. No wheezing, rhonchi or rales.  Abdominal:     General: Bowel sounds are normal. There is no distension or abdominal bruit.     Palpations: Abdomen is soft. There is no fluid wave or mass.     Tenderness: There is no abdominal tenderness. There is no guarding or rebound.     Hernia: No hernia is present.  Musculoskeletal:     Cervical back: Normal range of motion and neck supple.  Lymphadenopathy:     Cervical: No cervical adenopathy.  Skin:    General: Skin is warm and dry.     Findings: No rash.  Neurological:     Mental Status: She is alert.     Cranial Nerves: No cranial nerve deficit.     Sensory: No sensory deficit.  Psychiatric:        Mood and Affect: Mood is not anxious or depressed.        Speech: Speech normal.        Behavior: Behavior normal. Behavior is cooperative.        Judgment: Judgment normal.      Results for orders placed or performed in visit on 01/28/20  Fecal occult blood, imunochemical   Specimen: Stool  Result Value Ref Range   Fecal Occult Bld Positive (A) Negative    This visit occurred during the SARS-CoV-2 public health emergency.  Safety protocols were in place, including screening questions prior to the visit, additional usage of staff PPE, and extensive cleaning of exam room while observing appropriate contact time as indicated for disinfecting solutions.   COVID 19 screen:  No recent travel or known exposure to COVID19 The patient denies respiratory symptoms of COVID 19 at this time. The importance of social distancing was discussed today.   Assessment and Plan   The patient's preventative maintenance and recommended screening tests for an annual wellness exam were reviewed in full  today. Brought up to date unless services declined.  Counselled on the importance of diet, exercise, and its role in overall health and mortality. The patient's FH and SH was reviewed, including their home life, tobacco status, and drug and alcohol status.   Vaccines: COVID x 3 and shingles uptodate. Due for tdap .flu vaccine had in Oct 2022 Pap/DVE:  Per GYN last 2019, repeat due in  2024 Mammo:  12/30/2019, scheduled Bone Density:  normal 2020, repeat in 2025 Colon: 04/25/2020 tubular adenoma Dr. Silverio Decamp, repeat in 3 years.  Smoking Status:none ETOH/ drug FQH:KUVJ/DYNX  Hep C: done  Problem List Items Addressed This Visit     Hyperlipidemia (Chronic)    Due for re-eval  Encouraged compliance with lifestyle and statin.      Hypertension (Chronic)    Stable, chronic.  Continue current medication.   Amlodipine 10 mg daily      Hypothyroidism (Chronic)    Due for re-eval   levop 75 mcg daily      History of prediabetes   Routine general medical examination at a health care facility - Primary   Other Visit Diagnoses     Essential hypertension          Orders Released This Visit (4)                              Comprehensive metabolic panel - Released 01/13/2021 10:48 AM                         Routine, Clinic Collect                               Hemoglobin A1c - Released 01/13/2021 10:48 AM                         Routine, Clinic Collect                       Lipid panel - Released 01/13/2021 10:48 AM                         Routine, Clinic Collect               TSH - Released 01/13/2021 10:48 AM                         Routine,        Eliezer Lofts, MD

## 2021-01-13 NOTE — Patient Instructions (Signed)
Please stop at the lab to have labs drawn.  Get back on track with healthy eating and regular exercise.

## 2021-01-13 NOTE — Assessment & Plan Note (Signed)
Due for re-eval  Encouraged compliance with lifestyle and statin.

## 2021-01-13 NOTE — Assessment & Plan Note (Signed)
Due for re-eval   levop 75 mcg daily

## 2021-01-13 NOTE — Addendum Note (Signed)
Addended by: Carter Kitten on: 01/13/2021 11:18 AM   Modules accepted: Orders

## 2021-01-13 NOTE — Assessment & Plan Note (Signed)
Stable, chronic.  Continue current medication.  Amlodipine 10 mg daily 

## 2021-01-17 ENCOUNTER — Encounter: Payer: Self-pay | Admitting: Family Medicine

## 2021-01-17 ENCOUNTER — Other Ambulatory Visit: Payer: Self-pay | Admitting: Family Medicine

## 2021-01-18 DIAGNOSIS — Z01419 Encounter for gynecological examination (general) (routine) without abnormal findings: Secondary | ICD-10-CM | POA: Diagnosis not present

## 2021-01-18 DIAGNOSIS — Z124 Encounter for screening for malignant neoplasm of cervix: Secondary | ICD-10-CM | POA: Diagnosis not present

## 2021-01-18 DIAGNOSIS — Z113 Encounter for screening for infections with a predominantly sexual mode of transmission: Secondary | ICD-10-CM | POA: Diagnosis not present

## 2021-01-18 DIAGNOSIS — R102 Pelvic and perineal pain: Secondary | ICD-10-CM | POA: Diagnosis not present

## 2021-01-18 DIAGNOSIS — Z1231 Encounter for screening mammogram for malignant neoplasm of breast: Secondary | ICD-10-CM | POA: Diagnosis not present

## 2021-01-18 DIAGNOSIS — Z6828 Body mass index (BMI) 28.0-28.9, adult: Secondary | ICD-10-CM | POA: Diagnosis not present

## 2021-01-18 LAB — HM MAMMOGRAPHY

## 2021-01-24 ENCOUNTER — Encounter: Payer: Self-pay | Admitting: Family Medicine

## 2021-02-27 IMAGING — US US BREAST*R* LIMITED INC AXILLA
1 series · 5 of 5 positions shown · non-contrast
Comparison: Previous exam(s).

CLINICAL DATA: Screening recall for a possible mass in the right
breast. Patient has had a 50 pound weight loss since her previous
screening mammograms.

EXAM:
DIGITAL DIAGNOSTIC RIGHT MAMMOGRAM WITH CAD AND TOMO
ULTRASOUND RIGHT BREAST

[Series 1: us breast*right* limited inc axilla · 0.06mm/px · 5 of 5 slices shown]
[im 1/5]
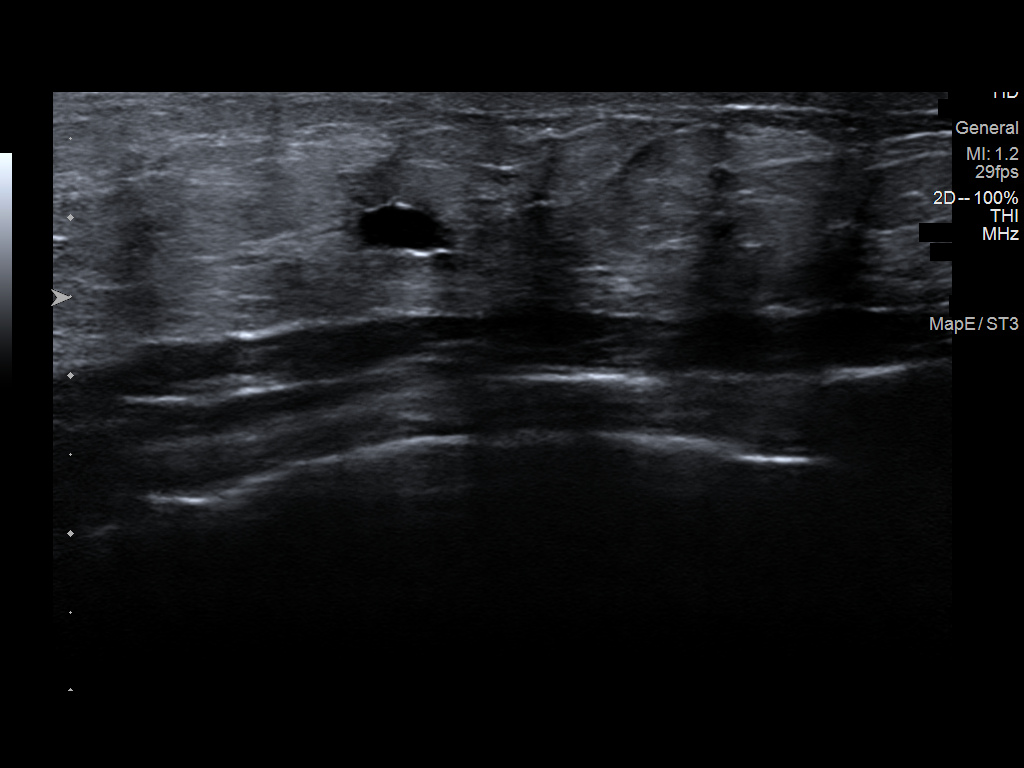
[im 2/5]
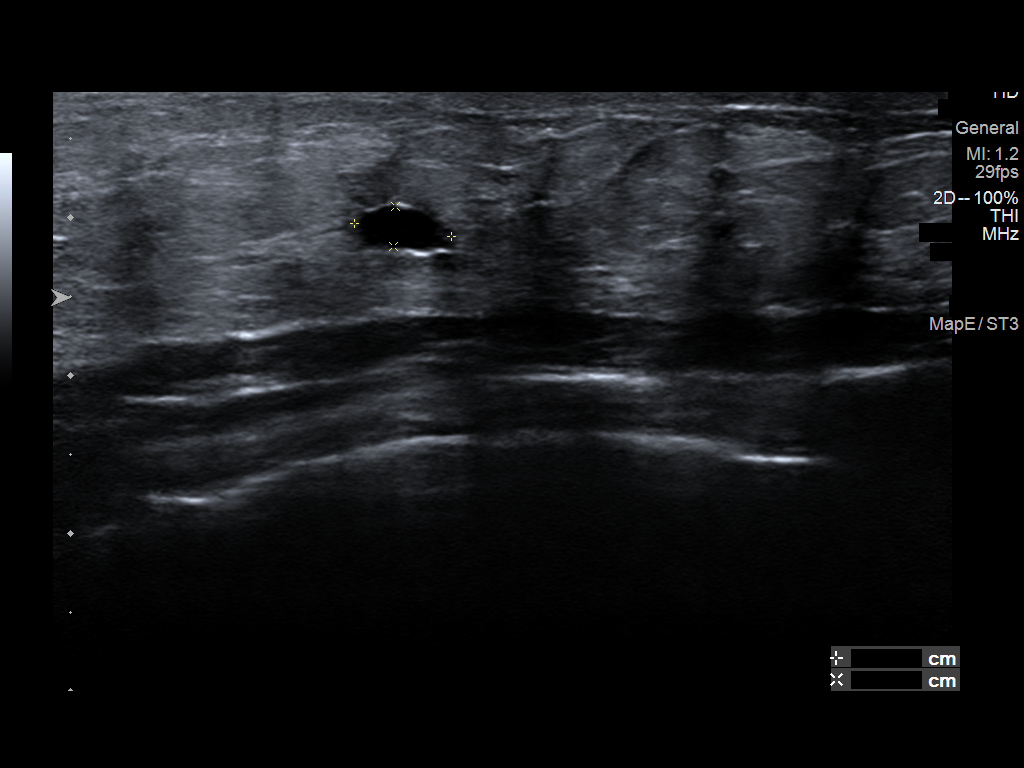
[im 3/5]
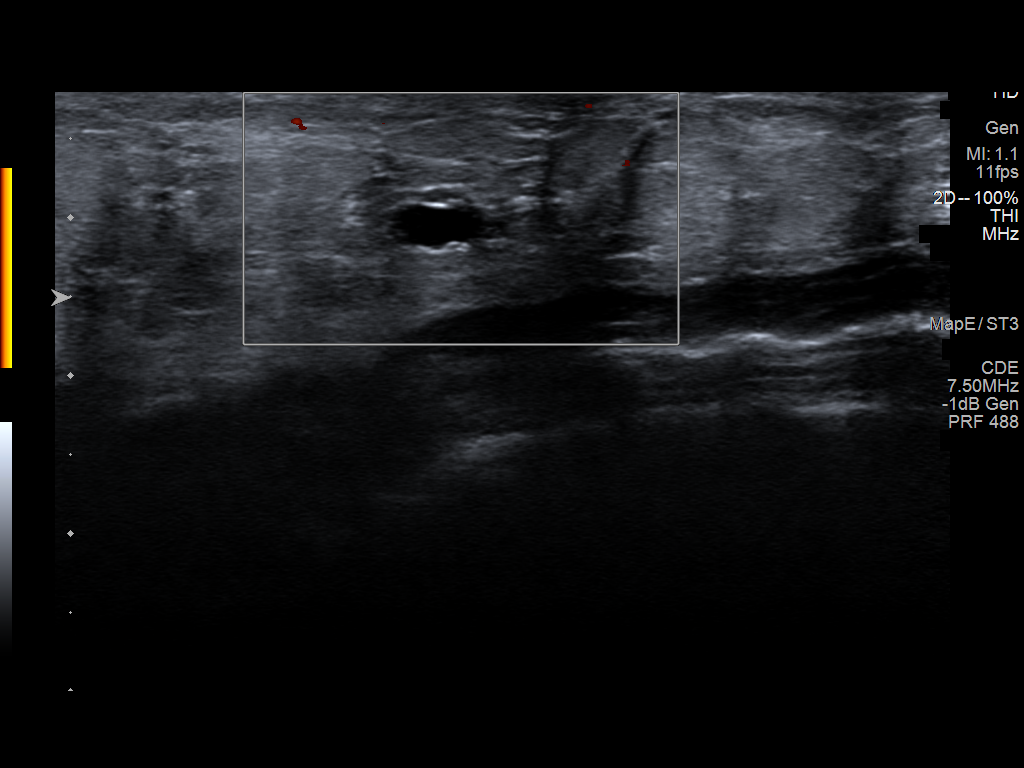
[im 4/5]
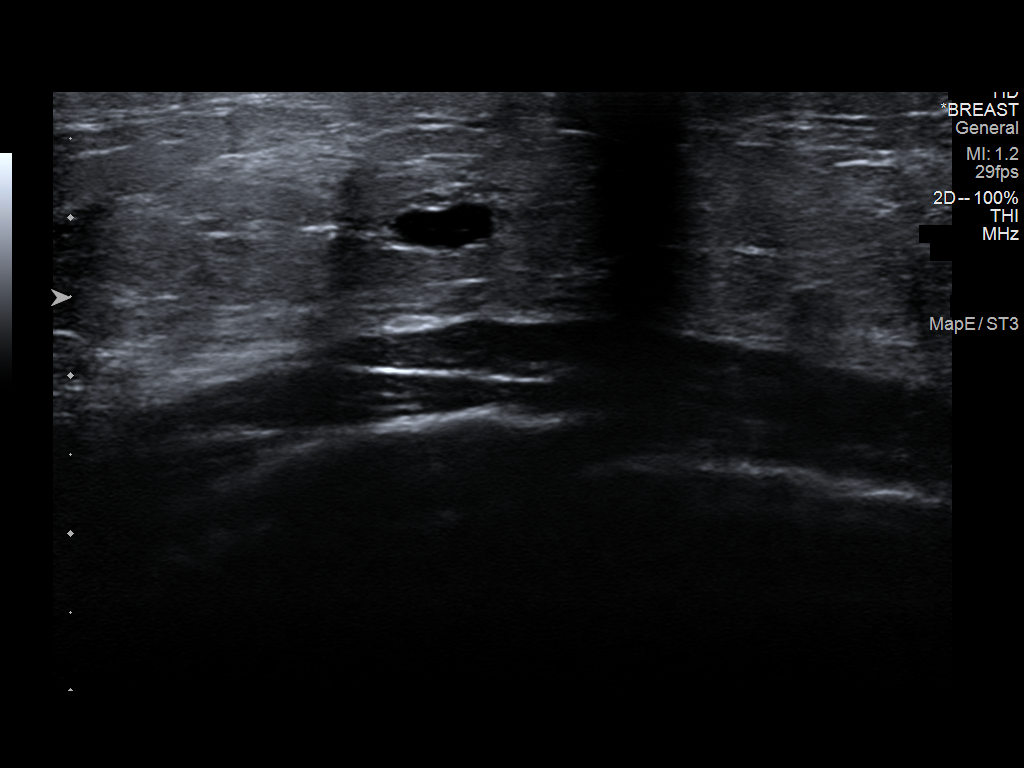
[im 5/5]
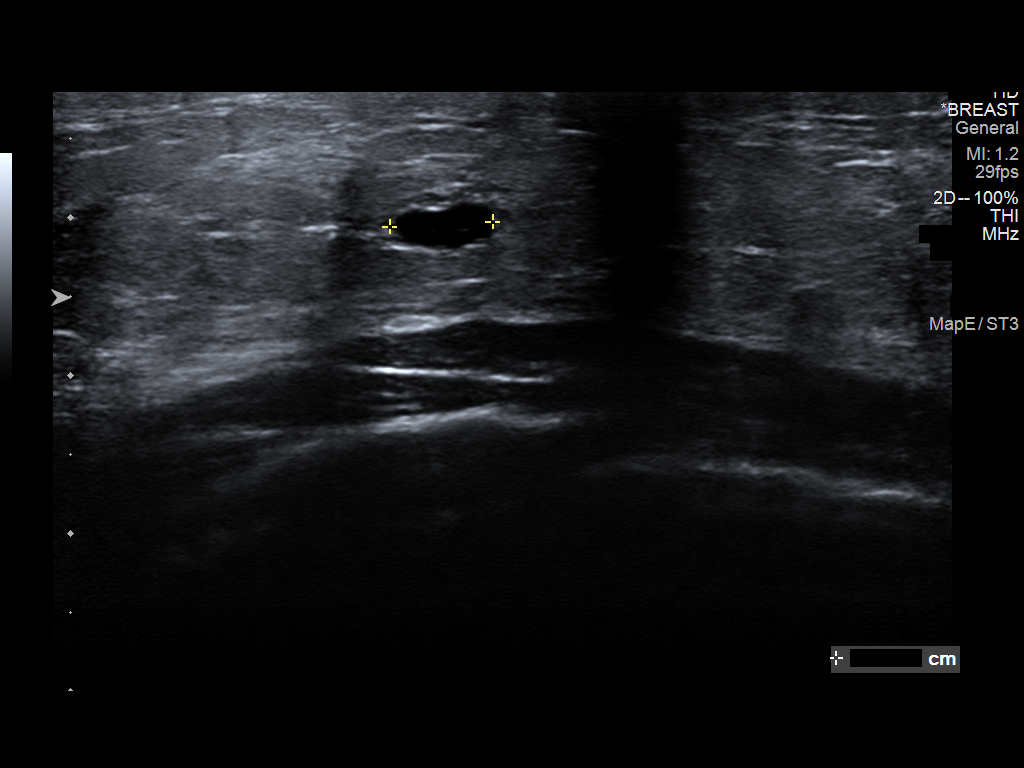

[5 of 5 positions shown; findings below may reference images not displayed]

ACR Breast Density Category b: There are scattered areas of
fibroglandular density.
FINDINGS: Possible mass noted in the inferior slightly lateral right breast
persists, best seen on the spot-compression CC view as an oval
mostly circumscribed mass. It measures 6 mm in long axis.

Mammographic images were processed with CAD.

Targeted ultrasound is performed, showing a simple cyst in the right
breast at 7 o'clock, retroareolar, middle depth, measuring 7 x 3 x 6
mm, consistent in size, shape and location to the mammographic mass.
There are no solid masses or suspicious lesions.
IMPRESSION: 1. No evidence of breast malignancy.
2. Benign right breast cyst.

RECOMMENDATION:
Screening mammogram in one year.(Code:[id number])

I have discussed the findings and recommendations with the patient.
If applicable, a reminder letter will be sent to the patient
regarding the next appointment.

BI-RADS CATEGORY  2: Benign.

## 2021-03-08 ENCOUNTER — Other Ambulatory Visit: Payer: Self-pay | Admitting: Family Medicine

## 2021-11-08 ENCOUNTER — Other Ambulatory Visit (HOSPITAL_BASED_OUTPATIENT_CLINIC_OR_DEPARTMENT_OTHER): Payer: Self-pay

## 2021-11-08 MED ORDER — COMIRNATY 30 MCG/0.3ML IM SUSY
PREFILLED_SYRINGE | INTRAMUSCULAR | 0 refills | Status: DC
  Filled 2021-11-08: qty 0.3, 1d supply, fill #0

## 2021-12-06 ENCOUNTER — Other Ambulatory Visit: Payer: Self-pay | Admitting: Family Medicine

## 2022-01-08 ENCOUNTER — Telehealth: Payer: Self-pay | Admitting: Family Medicine

## 2022-01-08 DIAGNOSIS — E785 Hyperlipidemia, unspecified: Secondary | ICD-10-CM

## 2022-01-08 DIAGNOSIS — Z87898 Personal history of other specified conditions: Secondary | ICD-10-CM

## 2022-01-08 DIAGNOSIS — E038 Other specified hypothyroidism: Secondary | ICD-10-CM

## 2022-01-08 NOTE — Telephone Encounter (Signed)
-----   Message from Velna Hatchet, RT sent at 12/25/2021 11:54 AM EST ----- Regarding: Thur 12/21 lab Patient is scheduled for cpx, please order future labs.  Thanks, Anda Kraft

## 2022-01-09 DIAGNOSIS — H25813 Combined forms of age-related cataract, bilateral: Secondary | ICD-10-CM | POA: Diagnosis not present

## 2022-01-09 DIAGNOSIS — R7309 Other abnormal glucose: Secondary | ICD-10-CM | POA: Diagnosis not present

## 2022-01-09 DIAGNOSIS — H524 Presbyopia: Secondary | ICD-10-CM | POA: Diagnosis not present

## 2022-01-09 DIAGNOSIS — H04123 Dry eye syndrome of bilateral lacrimal glands: Secondary | ICD-10-CM | POA: Diagnosis not present

## 2022-01-09 LAB — HM DIABETES EYE EXAM

## 2022-01-10 ENCOUNTER — Encounter: Payer: Self-pay | Admitting: Family Medicine

## 2022-01-11 ENCOUNTER — Other Ambulatory Visit (INDEPENDENT_AMBULATORY_CARE_PROVIDER_SITE_OTHER): Payer: Medicare Other

## 2022-01-11 ENCOUNTER — Other Ambulatory Visit: Payer: BC Managed Care – PPO

## 2022-01-11 DIAGNOSIS — E038 Other specified hypothyroidism: Secondary | ICD-10-CM

## 2022-01-11 DIAGNOSIS — Z87898 Personal history of other specified conditions: Secondary | ICD-10-CM

## 2022-01-11 DIAGNOSIS — E785 Hyperlipidemia, unspecified: Secondary | ICD-10-CM

## 2022-01-11 LAB — COMPREHENSIVE METABOLIC PANEL
ALT: 26 U/L (ref 0–35)
AST: 24 U/L (ref 0–37)
Albumin: 4.8 g/dL (ref 3.5–5.2)
Alkaline Phosphatase: 85 U/L (ref 39–117)
BUN: 17 mg/dL (ref 6–23)
CO2: 32 mEq/L (ref 19–32)
Calcium: 10.3 mg/dL (ref 8.4–10.5)
Chloride: 102 mEq/L (ref 96–112)
Creatinine, Ser: 0.83 mg/dL (ref 0.40–1.20)
GFR: 74.18 mL/min (ref >60.00)
Glucose, Bld: 113 mg/dL — ABNORMAL HIGH (ref 70–99)
Potassium: 3.9 mEq/L (ref 3.5–5.1)
Sodium: 141 mEq/L (ref 135–145)
Total Bilirubin: 1 mg/dL (ref 0.2–1.2)
Total Protein: 7.2 g/dL (ref 6.0–8.3)

## 2022-01-11 LAB — LIPID PANEL
Cholesterol: 185 mg/dL (ref 0–200)
HDL: 57.8 mg/dL (ref >39.00)
LDL Cholesterol: 104 mg/dL — ABNORMAL HIGH (ref 0–99)
NonHDL: 126.95
Total CHOL/HDL Ratio: 3
Triglycerides: 117 mg/dL (ref 0.0–149.0)
VLDL: 23.4 mg/dL (ref 0.0–40.0)

## 2022-01-11 LAB — HEMOGLOBIN A1C: Hgb A1c MFr Bld: 6.3 % (ref 4.6–6.5)

## 2022-01-11 LAB — TSH: TSH: 3.89 u[IU]/mL (ref 0.35–5.50)

## 2022-01-11 NOTE — Progress Notes (Signed)
No critical labs need to be addressed urgently. We will discuss labs in detail at upcoming office visit.   

## 2022-01-12 ENCOUNTER — Other Ambulatory Visit: Payer: Self-pay | Admitting: Family Medicine

## 2022-01-18 ENCOUNTER — Encounter: Payer: Self-pay | Admitting: Family Medicine

## 2022-01-18 ENCOUNTER — Encounter: Payer: BC Managed Care – PPO | Admitting: Family Medicine

## 2022-01-19 ENCOUNTER — Encounter: Payer: BC Managed Care – PPO | Admitting: Family Medicine

## 2022-01-23 ENCOUNTER — Ambulatory Visit (INDEPENDENT_AMBULATORY_CARE_PROVIDER_SITE_OTHER): Payer: Medicare Other | Admitting: Family Medicine

## 2022-01-23 VITALS — BP 120/80 | HR 67 | Temp 98.9°F | Ht 67.0 in | Wt 201.0 lb

## 2022-01-23 DIAGNOSIS — Z0000 Encounter for general adult medical examination without abnormal findings: Secondary | ICD-10-CM | POA: Diagnosis not present

## 2022-01-23 DIAGNOSIS — I10 Essential (primary) hypertension: Secondary | ICD-10-CM

## 2022-01-23 DIAGNOSIS — E785 Hyperlipidemia, unspecified: Secondary | ICD-10-CM | POA: Diagnosis not present

## 2022-01-23 DIAGNOSIS — E6609 Other obesity due to excess calories: Secondary | ICD-10-CM

## 2022-01-23 DIAGNOSIS — Z6831 Body mass index (BMI) 31.0-31.9, adult: Secondary | ICD-10-CM

## 2022-01-23 DIAGNOSIS — E038 Other specified hypothyroidism: Secondary | ICD-10-CM | POA: Diagnosis not present

## 2022-01-23 NOTE — Assessment & Plan Note (Signed)
Encouraged exercise, weight loss, healthy eating habits. Associated with hypertension or hyperlipidemia.

## 2022-01-23 NOTE — Assessment & Plan Note (Signed)
Stable, chronic.  Continue current medication.  Simvastatin 20 mg daily

## 2022-01-23 NOTE — Patient Instructions (Signed)
Work on  low [employer], increasing cardio exercise, and replace animal fats with veggie fats.

## 2022-01-23 NOTE — Progress Notes (Signed)
Patient ID: Elizabeth Fitzpatrick, female    DOB: Dec 28, 1955, 68 y.o.   MRN: [id number]  This visit was conducted in person.  BP 120/80   Pulse 67   Temp 98.9 F (37.2 C) (Oral)   Ht '5\' 7"'$  (1.702 m)   Wt 201 lb (91.2 kg)   SpO2 99%   BMI 31.48 kg/m    CC:  Chief Complaint  Patient presents with   Annual Exam    Subjective:   HPI: Elizabeth Fitzpatrick is a 67 y.o. female presenting on 01/23/2022 for  welcome to medicare wellness  I have personally reviewed the Medicare Annual Wellness questionnaire and have noted 1. The patient's medical and social history 2. Their use of alcohol, tobacco or illicit drugs 3. Their current medications and supplements 4. The patient's functional ability including ADL's, fall risks, home safety risks and hearing or visual             impairment. 5. Diet and physical activities 6. Evidence for depression or mood disorders 7.         Updated provider list Cognitive evaluation was performed and recorded on pt medicare questionnaire form. The patients weight, height, BMI and visual acuity have been recorded in the chart   I have made referrals, counseling and provided education to the patient based review of the above and I have provided the pt with a written personalized care plan for preventive services.   Documentation of this information was scanned into the electronic record under the media tab.   Advance directives and end of life planning reviewed in detail with patient and documented in EMR. Patient given handout on advance care directives if needed. HCPOA and living will updated if needed.    1 week ago accidental fall in last 12 months.  [place] Office Visit from 01/13/2021 in [place] at [facility] Total Score 0       Hearing and vision screening reviewed  Hearing Screening   '1000Hz'$  '2000Hz'$  '4000Hz'$  '5000Hz'$   Right ear Pass Pass Pass Pass  Left ear Pass Pass Pass Pass   Vision Screening   Right eye Left eye Both  eyes  Without correction '20/40 20/30 20/25 '$  With correction        Reviewed labs in detail with patient.  Hypertension:  Stable well-controlled on amlodipine 10 mg p.o. daily  BP Readings from Last 3 Encounters:  01/23/22 120/80  01/13/21 132/74  04/25/20 134/69  Using medication without problems or lightheadedness:  Chest pain with exertion: none Edema:none Short of breath: none Average home BPs: Other issues:  Exercise; occ  Diet: moderate.  She has been focusing on care giving for her sister.    High cholesterol: LDL almost  at goal less than 100 on simvastatin 20 mg p.o. daily Lab Results  Component Value Date   CHOL 185 01/11/2022   HDL 57.80 01/11/2022   LDLCALC 104 (H) 01/11/2022   LDLDIRECT 151.0 11/08/2017   TRIG 117.0 01/11/2022   CHOLHDL 3 01/11/2022   The 10-year ASCVD risk score (Arnett DK, et al., 2019) is: 6.2%   Values used to calculate the score:     Age: 61 years     Sex: Female     Is Non-Hispanic African American: No     Diabetic: No     Tobacco smoker: No     Systolic Blood Pressure: 678 mmHg     Is BP treated: Yes     HDL  Cholesterol: 57.8 mg/dL     Total Cholesterol: 185 mg/dL  Wt Readings from Last 3 Encounters:  01/23/22 201 lb (91.2 kg)  01/13/21 182 lb 4 oz (82.7 kg)  04/25/20 165 lb (74.8 kg)  Body mass index is 31.48 kg/m.   Hypothyroidism stable well-controlled on levo 75 mcg daily Lab Results  Component Value Date   TSH 3.89 01/11/2022          Relevant past medical, surgical, family and social history reviewed and updated as indicated. Interim medical history since our last visit reviewed. Allergies and medications reviewed and updated. Outpatient Medications Prior to Visit  Medication Sig Dispense Refill   amLODipine (NORVASC) 10 MG tablet TAKE 1 TABLET (10 MG TOTAL) BY MOUTH DAILY. 90 tablet 0   Calcium-Vitamin D-Vitamin K 500-100-40 MG-UNT-MCG CHEW Chew 2 tablets by mouth daily.     levothyroxine (SYNTHROID) 75  MCG tablet TAKE 1 TABLET (75 MCG TOTAL) BY MOUTH DAILY. 90 tablet 3   Multiple Vitamin (MULTIVITAMINS PO) Take 1 tablet by mouth daily.     simvastatin (ZOCOR) 20 MG tablet TAKE 1 TABLET (20 MG TOTAL) BY MOUTH AT BEDTIME. 90 tablet 3   COVID-19 mRNA vaccine 2023-2024 (COMIRNATY) syringe Inject into the muscle. 0.3 mL 0   No facility-administered medications prior to visit.     Per HPI unless specifically indicated in ROS section below Review of Systems  Constitutional:  Negative for fatigue and fever.  HENT:  Negative for congestion.   Eyes:  Negative for pain.  Respiratory:  Negative for cough and shortness of breath.   Cardiovascular:  Negative for chest pain, palpitations and leg swelling.  Gastrointestinal:  Negative for abdominal pain.  Genitourinary:  Negative for dysuria and vaginal bleeding.  Musculoskeletal:  Negative for back pain.  Neurological:  Negative for syncope, light-headedness and headaches.  Psychiatric/Behavioral:  Negative for dysphoric mood.    Objective:  BP 120/80   Pulse 67   Temp 98.9 F (37.2 C) (Oral)   Ht '5\' 7"'$  (1.702 m)   Wt 201 lb (91.2 kg)   SpO2 99%   BMI 31.48 kg/m   Wt Readings from Last 3 Encounters:  01/23/22 201 lb (91.2 kg)  01/13/21 182 lb 4 oz (82.7 kg)  04/25/20 165 lb (74.8 kg)      Physical Exam Vitals and nursing note reviewed.  Constitutional:      General: She is not in acute distress.    Appearance: Normal appearance. She is well-developed. She is not ill-appearing or toxic-appearing.  HENT:     Head: Normocephalic.     Right Ear: Hearing, tympanic membrane, ear canal and external ear normal.     Left Ear: Hearing, tympanic membrane, ear canal and external ear normal.     Nose: Nose normal.  Eyes:     General: Lids are normal. Lids are everted, no foreign bodies appreciated.     Conjunctiva/sclera: Conjunctivae normal.     Pupils: Pupils are equal, round, and reactive to light.  Neck:     Thyroid: No thyroid mass or  thyromegaly.     Vascular: No carotid bruit.     Trachea: Trachea normal.  Cardiovascular:     Rate and Rhythm: Normal rate and regular rhythm.     Heart sounds: Normal heart sounds, S1 normal and S2 normal. No murmur heard.    No gallop.  Pulmonary:     Effort: Pulmonary effort is normal. No respiratory distress.     Breath sounds: Normal  breath sounds. No wheezing, rhonchi or rales.  Abdominal:     General: Bowel sounds are normal. There is no distension or abdominal bruit.     Palpations: Abdomen is soft. There is no fluid wave or mass.     Tenderness: There is no abdominal tenderness. There is no guarding or rebound.     Hernia: No hernia is present.  Musculoskeletal:     Cervical back: Normal range of motion and neck supple.  Lymphadenopathy:     Cervical: No cervical adenopathy.  Skin:    General: Skin is warm and dry.     Findings: No rash.  Neurological:     Mental Status: She is alert.     Cranial Nerves: No cranial nerve deficit.     Sensory: No sensory deficit.  Psychiatric:        Mood and Affect: Mood is not anxious or depressed.        Speech: Speech normal.        Behavior: Behavior normal. Behavior is cooperative.        Judgment: Judgment normal.       Results for orders placed or performed in visit on 01/11/22  Hemoglobin A1c  Result Value Ref Range   Hgb A1c MFr Bld 6.3 4.6 - 6.5 %  TSH  Result Value Ref Range   TSH 3.89 0.35 - 5.50 uIU/mL  Comprehensive metabolic panel  Result Value Ref Range   Sodium 141 135 - 145 mEq/L   Potassium 3.9 3.5 - 5.1 mEq/L   Chloride 102 96 - 112 mEq/L   CO2 32 19 - 32 mEq/L   Glucose, Bld 113 (H) 70 - 99 mg/dL   BUN 17 6 - 23 mg/dL   Creatinine, Ser 0.83 0.40 - 1.20 mg/dL   Total Bilirubin 1.0 0.2 - 1.2 mg/dL   Alkaline Phosphatase 85 39 - 117 U/L   AST 24 0 - 37 U/L   ALT 26 0 - 35 U/L   Total Protein 7.2 6.0 - 8.3 g/dL   Albumin 4.8 3.5 - 5.2 g/dL   GFR 74.18 >60.00 mL/min   Calcium 10.3 8.4 - 10.5 mg/dL   Lipid panel  Result Value Ref Range   Cholesterol 185 0 - 200 mg/dL   Triglycerides 117.0 0.0 - 149.0 mg/dL   HDL 57.80 >39.00 mg/dL   VLDL 23.4 0.0 - 40.0 mg/dL   LDL Cholesterol 104 (H) 0 - 99 mg/dL   Total CHOL/HDL Ratio 3    NonHDL 126.95      COVID 19 screen:  No recent travel or known exposure to COVID19 The patient denies respiratory symptoms of COVID 19 at this time. The importance of social distancing was discussed today.   Assessment and Plan The patient's preventative maintenance and recommended screening tests for an annual wellness exam were reviewed in full today. Brought up to date unless services declined.  Counselled on the importance of diet, exercise, and its role in overall health and mortality. The patient's FH and SH was reviewed, including their home life, tobacco status, and drug and alcohol status.   Vaccines: COVID x 3 and shingles uptodate. Uptodate tdap .flu vaccine had in 09/2021.. due for Prevnar 20 Pap/DVE:  Per GYN last 2019, further indicated. Mammo:   at GYN Bone Density:  normal 2020, repeat in 2025 Colon: 04/25/2020 tubular adenoma Dr. Silverio Decamp, repeat in 3 years.  Smoking Status:none ETOH/ drug IPJ:ASNK/NLZJ  Hep C: done  EKG performed today for screening EKG: normal  EKG, normal sinus rhythm.  Problem List Items Addressed This Visit     Hyperlipidemia (Chronic)    Stable, chronic.  Continue current medication.  Simvastatin 20 mg daily      Hypertension (Chronic)    Stable, chronic.  Continue current medication.  Amlodipine 10 mg daily      Hypothyroidism (Chronic)    Stable, chronic.  Continue current medication.  Levo 75 mcg p.o. daily      Class 1 obesity due to excess calories with serious comorbidity and body mass index (BMI) of 31.0 to 31.9 in adult    Encouraged exercise, weight loss, healthy eating habits. Associated with hypertension or hyperlipidemia.      Other Visit Diagnoses     Welcome to Medicare  preventive visit    -  Primary   Relevant Orders   EKG 12-Lead (Completed)        Eliezer Lofts, MD

## 2022-01-23 NOTE — Assessment & Plan Note (Signed)
Stable, chronic.  Continue current medication.  Levo 75 mcg p.o. daily

## 2022-01-23 NOTE — Assessment & Plan Note (Signed)
Stable, chronic.  Continue current medication.  Amlodipine 10 mg daily

## 2022-01-25 DIAGNOSIS — Z1231 Encounter for screening mammogram for malignant neoplasm of breast: Secondary | ICD-10-CM | POA: Diagnosis not present

## 2022-01-25 DIAGNOSIS — Z124 Encounter for screening for malignant neoplasm of cervix: Secondary | ICD-10-CM | POA: Diagnosis not present

## 2022-01-25 DIAGNOSIS — Z01419 Encounter for gynecological examination (general) (routine) without abnormal findings: Secondary | ICD-10-CM | POA: Diagnosis not present

## 2022-01-30 ENCOUNTER — Other Ambulatory Visit: Payer: Self-pay | Admitting: Obstetrics and Gynecology

## 2022-01-30 DIAGNOSIS — N63 Unspecified lump in unspecified breast: Secondary | ICD-10-CM

## 2022-02-13 ENCOUNTER — Encounter: Payer: Self-pay | Admitting: Family Medicine

## 2022-02-13 NOTE — Telephone Encounter (Signed)
Elizabeth Pina,  do you know who was working Dr. Diona Browner this day.  It has you as doing the vital signs.  Do you remember doing a pneumonia vaccine on her?

## 2022-02-14 ENCOUNTER — Other Ambulatory Visit: Payer: Self-pay | Admitting: Family Medicine

## 2022-02-14 NOTE — Addendum Note (Signed)
Addended by: Cordelia Pen on: 02/14/2022 08:55 AM   Modules accepted: Orders

## 2022-02-15 ENCOUNTER — Ambulatory Visit
Admission: RE | Admit: 2022-02-15 | Discharge: 2022-02-15 | Disposition: A | Discharge status: Home / Self Care | Payer: Medicare Other | Source: Ambulatory Visit | Attending: Obstetrics and Gynecology | Admitting: Obstetrics and Gynecology

## 2022-02-15 ENCOUNTER — Other Ambulatory Visit: Payer: Self-pay | Admitting: Obstetrics and Gynecology

## 2022-02-15 DIAGNOSIS — N63 Unspecified lump in unspecified breast: Secondary | ICD-10-CM

## 2022-02-15 DIAGNOSIS — R928 Other abnormal and inconclusive findings on diagnostic imaging of breast: Secondary | ICD-10-CM | POA: Diagnosis not present

## 2022-02-20 ENCOUNTER — Ambulatory Visit
Admission: RE | Admit: 2022-02-20 | Discharge: 2022-02-20 | Disposition: A | Discharge status: Home / Self Care | Payer: Medicare Other | Source: Ambulatory Visit | Attending: Obstetrics and Gynecology | Admitting: Obstetrics and Gynecology

## 2022-02-20 DIAGNOSIS — N6323 Unspecified lump in the left breast, lower outer quadrant: Secondary | ICD-10-CM | POA: Diagnosis not present

## 2022-02-20 DIAGNOSIS — N63 Unspecified lump in unspecified breast: Secondary | ICD-10-CM

## 2022-02-20 DIAGNOSIS — D0512 Intraductal carcinoma in situ of left breast: Secondary | ICD-10-CM | POA: Diagnosis not present

## 2022-02-20 HISTORY — PX: BREAST BIOPSY: SHX20

## 2022-03-05 ENCOUNTER — Other Ambulatory Visit: Payer: Self-pay | Admitting: Family Medicine

## 2022-03-05 ENCOUNTER — Ambulatory Visit: Payer: Self-pay | Admitting: Surgery

## 2022-03-05 DIAGNOSIS — N6092 Unspecified benign mammary dysplasia of left breast: Secondary | ICD-10-CM

## 2022-03-05 DIAGNOSIS — Z1239 Encounter for other screening for malignant neoplasm of breast: Secondary | ICD-10-CM | POA: Diagnosis not present

## 2022-03-08 ENCOUNTER — Telehealth: Payer: Self-pay | Admitting: Genetic Counselor

## 2022-03-08 NOTE — Telephone Encounter (Signed)
Scheduled appt per 2/14 referral. Pt is aware of appt date and time. Pt is aware to arrive 15 mins prior to appt time and to bring and updated insurance card. Pt is aware of appt location.

## 2022-03-09 ENCOUNTER — Other Ambulatory Visit: Payer: Self-pay | Admitting: Surgery

## 2022-03-09 DIAGNOSIS — N6092 Unspecified benign mammary dysplasia of left breast: Secondary | ICD-10-CM

## 2022-04-02 ENCOUNTER — Encounter (HOSPITAL_BASED_OUTPATIENT_CLINIC_OR_DEPARTMENT_OTHER): Payer: Self-pay | Admitting: Surgery

## 2022-04-02 ENCOUNTER — Other Ambulatory Visit: Payer: Self-pay

## 2022-04-04 ENCOUNTER — Ambulatory Visit
Admission: RE | Admit: 2022-04-04 | Discharge: 2022-04-04 | Disposition: A | Discharge status: Home / Self Care | Payer: Medicare Other | Source: Ambulatory Visit | Attending: Surgery | Admitting: Surgery

## 2022-04-04 DIAGNOSIS — N6092 Unspecified benign mammary dysplasia of left breast: Secondary | ICD-10-CM

## 2022-04-04 DIAGNOSIS — D0512 Intraductal carcinoma in situ of left breast: Secondary | ICD-10-CM | POA: Diagnosis not present

## 2022-04-04 HISTORY — PX: BREAST BIOPSY: SHX20

## 2022-04-04 NOTE — Progress Notes (Signed)

## 2022-04-05 ENCOUNTER — Ambulatory Visit (HOSPITAL_BASED_OUTPATIENT_CLINIC_OR_DEPARTMENT_OTHER)
Admission: RE | Admit: 2022-04-05 | Discharge: 2022-04-05 | Disposition: A | Discharge status: Home / Self Care | Payer: Medicare Other | Attending: Surgery | Admitting: Surgery

## 2022-04-05 ENCOUNTER — Other Ambulatory Visit: Payer: Self-pay

## 2022-04-05 ENCOUNTER — Ambulatory Visit (HOSPITAL_BASED_OUTPATIENT_CLINIC_OR_DEPARTMENT_OTHER): Payer: Medicare Other | Admitting: Anesthesiology

## 2022-04-05 ENCOUNTER — Encounter (HOSPITAL_BASED_OUTPATIENT_CLINIC_OR_DEPARTMENT_OTHER): Payer: Self-pay | Admitting: Surgery

## 2022-04-05 ENCOUNTER — Encounter (HOSPITAL_BASED_OUTPATIENT_CLINIC_OR_DEPARTMENT_OTHER)
Admission: RE | Disposition: A | Discharge status: Home / Self Care | Payer: Self-pay | Source: Home / Self Care | Attending: Surgery

## 2022-04-05 ENCOUNTER — Ambulatory Visit
Admission: RE | Admit: 2022-04-05 | Discharge: 2022-04-05 | Disposition: A | Discharge status: Home / Self Care | Payer: Medicare Other | Source: Ambulatory Visit | Attending: Surgery | Admitting: Surgery

## 2022-04-05 DIAGNOSIS — N6082 Other benign mammary dysplasias of left breast: Secondary | ICD-10-CM | POA: Diagnosis not present

## 2022-04-05 DIAGNOSIS — N6092 Unspecified benign mammary dysplasia of left breast: Secondary | ICD-10-CM

## 2022-04-05 DIAGNOSIS — E039 Hypothyroidism, unspecified: Secondary | ICD-10-CM | POA: Diagnosis not present

## 2022-04-05 DIAGNOSIS — R9230 Dense breasts, unspecified: Secondary | ICD-10-CM | POA: Insufficient documentation

## 2022-04-05 DIAGNOSIS — Z79899 Other long term (current) drug therapy: Secondary | ICD-10-CM | POA: Diagnosis not present

## 2022-04-05 DIAGNOSIS — N6012 Diffuse cystic mastopathy of left breast: Secondary | ICD-10-CM | POA: Diagnosis not present

## 2022-04-05 DIAGNOSIS — D0512 Intraductal carcinoma in situ of left breast: Secondary | ICD-10-CM | POA: Diagnosis not present

## 2022-04-05 DIAGNOSIS — Z803 Family history of malignant neoplasm of breast: Secondary | ICD-10-CM | POA: Diagnosis not present

## 2022-04-05 DIAGNOSIS — I10 Essential (primary) hypertension: Secondary | ICD-10-CM | POA: Insufficient documentation

## 2022-04-05 DIAGNOSIS — R928 Other abnormal and inconclusive findings on diagnostic imaging of breast: Secondary | ICD-10-CM | POA: Diagnosis not present

## 2022-04-05 HISTORY — PX: BREAST LUMPECTOMY WITH RADIOACTIVE SEED LOCALIZATION: SHX6424

## 2022-04-05 HISTORY — DX: Prediabetes: R73.03

## 2022-04-05 SURGERY — BREAST LUMPECTOMY WITH RADIOACTIVE SEED LOCALIZATION
Anesthesia: General | Site: Breast | Laterality: Left

## 2022-04-05 MED ORDER — ONDANSETRON HCL 4 MG/2ML IJ SOLN: 4.0000 mg | Freq: Once | INTRAMUSCULAR | Status: DC | PRN

## 2022-04-05 MED ORDER — CEFAZOLIN SODIUM-DEXTROSE 2-4 GM/100ML-% IV SOLN
INTRAVENOUS | Status: AC
  Filled 2022-04-05: qty 100

## 2022-04-05 MED ORDER — AMISULPRIDE (ANTIEMETIC) 5 MG/2ML IV SOLN: 10.0000 mg | Freq: Once | INTRAVENOUS | Status: DC | PRN

## 2022-04-05 MED ORDER — CEFAZOLIN SODIUM-DEXTROSE 2-4 GM/100ML-% IV SOLN: 2.0000 g | INTRAVENOUS | Status: DC

## 2022-04-05 MED ORDER — ONDANSETRON HCL 4 MG/2ML IJ SOLN
INTRAMUSCULAR | Status: DC | PRN
  Administered 2022-04-05: 4 mg via INTRAVENOUS

## 2022-04-05 MED ORDER — BUPIVACAINE-EPINEPHRINE (PF) 0.25% -1:200000 IJ SOLN
INTRAMUSCULAR | Status: DC | PRN
  Administered 2022-04-05: 20 mL via PERINEURAL

## 2022-04-05 MED ORDER — LACTATED RINGERS IV SOLN: INTRAVENOUS | Status: DC | PRN

## 2022-04-05 MED ORDER — ONDANSETRON HCL 4 MG/2ML IJ SOLN
INTRAMUSCULAR | Status: AC
  Filled 2022-04-05: qty 2

## 2022-04-05 MED ORDER — BUPIVACAINE-EPINEPHRINE (PF) 0.25% -1:200000 IJ SOLN
INTRAMUSCULAR | Status: AC
  Filled 2022-04-05: qty 30

## 2022-04-05 MED ORDER — ACETAMINOPHEN 500 MG PO TABS
ORAL_TABLET | ORAL | Status: AC
  Filled 2022-04-05: qty 2

## 2022-04-05 MED ORDER — LIDOCAINE HCL (CARDIAC) PF 100 MG/5ML IV SOSY
PREFILLED_SYRINGE | INTRAVENOUS | Status: DC | PRN
  Administered 2022-04-05: 80 mg via INTRAVENOUS

## 2022-04-05 MED ORDER — LIDOCAINE 2% (20 MG/ML) 5 ML SYRINGE
INTRAMUSCULAR | Status: AC
  Filled 2022-04-05: qty 5

## 2022-04-05 MED ORDER — FENTANYL CITRATE (PF) 100 MCG/2ML IJ SOLN
INTRAMUSCULAR | Status: DC | PRN
  Administered 2022-04-05: 100 ug via INTRAVENOUS

## 2022-04-05 MED ORDER — FENTANYL CITRATE (PF) 100 MCG/2ML IJ SOLN: 25.0000 ug | INTRAMUSCULAR | Status: DC | PRN

## 2022-04-05 MED ORDER — PROPOFOL 10 MG/ML IV BOLUS
INTRAVENOUS | Status: AC
  Filled 2022-04-05: qty 20

## 2022-04-05 MED ORDER — FENTANYL CITRATE (PF) 100 MCG/2ML IJ SOLN
INTRAMUSCULAR | Status: AC
  Filled 2022-04-05: qty 2

## 2022-04-05 MED ORDER — SODIUM CHLORIDE 0.9 % IV SOLN
INTRAVENOUS | Status: AC
  Filled 2022-04-05: qty 10

## 2022-04-05 MED ORDER — CHLORHEXIDINE GLUCONATE CLOTH 2 % EX PADS: 6.0000 | MEDICATED_PAD | Freq: Once | CUTANEOUS | Status: DC

## 2022-04-05 MED ORDER — PROPOFOL 500 MG/50ML IV EMUL
INTRAVENOUS | Status: DC | PRN
  Administered 2022-04-05: 200 ug/kg/min via INTRAVENOUS

## 2022-04-05 MED ORDER — ACETAMINOPHEN 500 MG PO TABS
1000.0000 mg | ORAL_TABLET | Freq: Once | ORAL | Status: AC
  Administered 2022-04-05: 1000 mg via ORAL

## 2022-04-05 MED ORDER — DEXAMETHASONE SODIUM PHOSPHATE 10 MG/ML IJ SOLN
INTRAMUSCULAR | Status: DC | PRN
  Administered 2022-04-05: 5 mg via INTRAVENOUS

## 2022-04-05 MED ORDER — OXYCODONE HCL 5 MG PO TABS: 5.0000 mg | ORAL_TABLET | Freq: Four times a day (QID) | ORAL | 0 refills | Status: DC | PRN

## 2022-04-05 MED ORDER — KETOROLAC TROMETHAMINE 15 MG/ML IJ SOLN: 15.0000 mg | Freq: Once | INTRAMUSCULAR | Status: DC | PRN

## 2022-04-05 MED ORDER — PROPOFOL 10 MG/ML IV BOLUS
INTRAVENOUS | Status: DC | PRN
  Administered 2022-04-05 (×2): 50 mg via INTRAVENOUS

## 2022-04-05 MED ORDER — DEXAMETHASONE SODIUM PHOSPHATE 10 MG/ML IJ SOLN
INTRAMUSCULAR | Status: AC
  Filled 2022-04-05: qty 1

## 2022-04-05 MED ORDER — CEFAZOLIN SODIUM-DEXTROSE 2-3 GM-%(50ML) IV SOLR
INTRAVENOUS | Status: DC | PRN
  Administered 2022-04-05: 2 g via INTRAVENOUS

## 2022-04-05 MED ORDER — LACTATED RINGERS IV SOLN: INTRAVENOUS | Status: DC

## 2022-04-05 SURGICAL SUPPLY — 49 items
ADH SKN CLS APL DERMABOND .7 (GAUZE/BANDAGES/DRESSINGS) ×1
APL PRP STRL LF DISP 70% ISPRP (MISCELLANEOUS) ×1
APPLIER CLIP 9.375 MED OPEN (MISCELLANEOUS)
APR CLP MED 9.3 20 MLT OPN (MISCELLANEOUS)
BINDER BREAST LRG (GAUZE/BANDAGES/DRESSINGS) IMPLANT
BINDER BREAST MEDIUM (GAUZE/BANDAGES/DRESSINGS) IMPLANT
BINDER BREAST XLRG (GAUZE/BANDAGES/DRESSINGS) IMPLANT
BINDER BREAST XXLRG (GAUZE/BANDAGES/DRESSINGS) IMPLANT
BLADE SURG 15 STRL LF DISP TIS (BLADE) ×2 IMPLANT
BLADE SURG 15 STRL SS (BLADE) ×1
CANISTER SUC SOCK COL 7IN (MISCELLANEOUS) IMPLANT
CANISTER SUCT 1200ML W/VALVE (MISCELLANEOUS) IMPLANT
CHLORAPREP W/TINT 26 (MISCELLANEOUS) ×2 IMPLANT
CLIP APPLIE 9.375 MED OPEN (MISCELLANEOUS) IMPLANT
COVER BACK TABLE 60X90IN (DRAPES) ×2 IMPLANT
COVER MAYO STAND STRL (DRAPES) ×2 IMPLANT
COVER PROBE CYLINDRICAL 5X96 (MISCELLANEOUS) ×2 IMPLANT
DERMABOND ADVANCED .7 DNX12 (GAUZE/BANDAGES/DRESSINGS) ×2 IMPLANT
DRAPE LAPAROSCOPIC ABDOMINAL (DRAPES) IMPLANT
DRAPE LAPAROTOMY 100X72 PEDS (DRAPES) ×2 IMPLANT
DRAPE UTILITY XL STRL (DRAPES) ×2 IMPLANT
ELECT COATED BLADE 2.86 ST (ELECTRODE) ×2 IMPLANT
ELECT REM PT RETURN 9FT ADLT (ELECTROSURGICAL) ×1
ELECTRODE REM PT RTRN 9FT ADLT (ELECTROSURGICAL) ×2 IMPLANT
GLOVE BIOGEL PI IND STRL 8 (GLOVE) ×2 IMPLANT
GLOVE ECLIPSE 8.0 STRL XLNG CF (GLOVE) ×2 IMPLANT
GOWN STRL REUS W/ TWL LRG LVL3 (GOWN DISPOSABLE) ×4 IMPLANT
GOWN STRL REUS W/ TWL XL LVL3 (GOWN DISPOSABLE) ×2 IMPLANT
GOWN STRL REUS W/TWL LRG LVL3 (GOWN DISPOSABLE) ×2
GOWN STRL REUS W/TWL XL LVL3 (GOWN DISPOSABLE) ×1
HEMOSTAT ARISTA ABSORB 3G PWDR (HEMOSTASIS) IMPLANT
HEMOSTAT SNOW SURGICEL 2X4 (HEMOSTASIS) IMPLANT
KIT MARKER MARGIN INK (KITS) ×2 IMPLANT
NDL HYPO 25X1 1.5 SAFETY (NEEDLE) ×2 IMPLANT
NEEDLE HYPO 25X1 1.5 SAFETY (NEEDLE) ×1 IMPLANT
NS IRRIG 1000ML POUR BTL (IV SOLUTION) ×2 IMPLANT
PACK BASIN DAY SURGERY FS (CUSTOM PROCEDURE TRAY) ×2 IMPLANT
PENCIL SMOKE EVACUATOR (MISCELLANEOUS) ×2 IMPLANT
SLEEVE SCD COMPRESS KNEE MED (STOCKING) ×2 IMPLANT
SPIKE FLUID TRANSFER (MISCELLANEOUS) IMPLANT
SPONGE T-LAP 4X18 ~~LOC~~+RFID (SPONGE) ×2 IMPLANT
SUT MNCRL AB 4-0 PS2 18 (SUTURE) ×2 IMPLANT
SUT SILK 2 0 SH (SUTURE) IMPLANT
SUT VICRYL 3-0 CR8 SH (SUTURE) ×2 IMPLANT
SYR CONTROL 10ML LL (SYRINGE) ×2 IMPLANT
TOWEL GREEN STERILE FF (TOWEL DISPOSABLE) ×2 IMPLANT
TRAY FAXITRON CT DISP (TRAY / TRAY PROCEDURE) ×2 IMPLANT
TUBE CONNECTING 20X1/4 (TUBING) IMPLANT
YANKAUER SUCT BULB TIP NO VENT (SUCTIONS) IMPLANT

## 2022-04-05 NOTE — Transfer of Care (Signed)
Immediate Anesthesia Transfer of Care Note  Patient: Elizabeth Fitzpatrick [facility]  Procedure(s) Performed: LEFT BREAST LUMPECTOMY WITH RADIOACTIVE SEED LOCALIZATION (Left: Breast)  Patient Location: PACU  Anesthesia Type:General  Level of Consciousness: awake and patient cooperative  Airway & Oxygen Therapy: Patient Spontanous Breathing and Patient connected to face mask oxygen  Post-op Assessment: Report given to RN and Post -op Vital signs reviewed and stable  Post vital signs: Reviewed and stable  Last Vitals:  Vitals Value Taken Time  BP 103/57 04/05/22 1013  Temp    Pulse 57 04/05/22 1015  Resp 12 04/05/22 1015  SpO2 98 % 04/05/22 1015  Vitals shown include unvalidated device data.  Last Pain:  Vitals:   04/05/22 0829  TempSrc: Oral  PainSc: 0-No pain         Complications: No notable events documented.

## 2022-04-05 NOTE — Interval H&P Note (Signed)
History and Physical Interval Note:  04/05/2022 9:01 AM  Elizabeth Fitzpatrick  has presented today for surgery, with the diagnosis of LEFT BREAST ATYPICAL DUCTAL HYPERPLASIA.  The various methods of treatment have been discussed with the patient and family. After consideration of risks, benefits and other options for treatment, the patient has consented to  Procedure(s): LEFT BREAST LUMPECTOMY WITH RADIOACTIVE SEED LOCALIZATION (Left) as a surgical intervention.  The patient's history has been reviewed, patient examined, no change in status, stable for surgery.  I have reviewed the patient's chart and labs.  Questions were answered to the patient's satisfaction.     [place]

## 2022-04-05 NOTE — Anesthesia Procedure Notes (Addendum)
Procedure Name: LMA Insertion Date/Time: 04/05/2022 9:41 AM  Performed by: Verita Lamb, CRNAPre-anesthesia Checklist: Patient identified, Emergency Drugs available, Suction available and Patient being monitored Patient Re-evaluated:Patient Re-evaluated prior to induction Oxygen Delivery Method: Circle system utilized Preoxygenation: Pre-oxygenation with 100% oxygen Induction Type: IV induction Ventilation: Mask ventilation without difficulty LMA: LMA inserted LMA Size: 4.0 Number of attempts: 1 Airway Equipment and Method: Bite block Placement Confirmation: positive ETCO2, CO2 detector and breath sounds checked- equal and bilateral Tube secured with: Tape Dental Injury: Teeth and Oropharynx as per pre-operative assessment

## 2022-04-05 NOTE — Discharge Instructions (Addendum)
[place] Office Phone Number [phone]  BREAST BIOPSY/ PARTIAL MASTECTOMY: POST OP INSTRUCTIONS  Always review your discharge instruction sheet given to you by the facility where your surgery was performed.  IF YOU HAVE DISABILITY OR FAMILY LEAVE FORMS, YOU MUST BRING THEM TO THE OFFICE FOR PROCESSING.  DO NOT GIVE THEM TO YOUR DOCTOR.  A prescription for pain medication may be given to you upon discharge.  Take your pain medication as prescribed, if needed.  If narcotic pain medicine is not needed, then you may take acetaminophen (Tylenol) or ibuprofen (Advil) as needed. Take your usually prescribed medications unless otherwise directed If you need a refill on your pain medication, please contact your pharmacy.  They will contact our office to request authorization.  Prescriptions will not be filled after 5pm or on week-ends. You should eat very light the first 24 hours after surgery, such as soup, crackers, pudding, etc.  Resume your normal diet the day after surgery. Most patients will experience some swelling and bruising in the breast.  Ice packs and a good support bra will help.  Swelling and bruising can take several days to resolve.  It is common to experience some constipation if taking pain medication after surgery.  Increasing fluid intake and taking a stool softener will usually help or prevent this problem from occurring.  A mild laxative (Milk of Magnesia or Miralax) should be taken according to package directions if there are no bowel movements after 48 hours. Unless discharge instructions indicate otherwise, you may remove your bandages 24-48 hours after surgery, and you may shower at that time.  You may have steri-strips (small skin tapes) in place directly over the incision.  These strips should be left on the skin for 7-10 days.  If your surgeon used skin glue on the incision, you may shower in 24 hours.  The glue will flake off over the next 2-3 weeks.  Any  sutures or staples will be removed at the office during your follow-up visit. ACTIVITIES:  You may resume regular daily activities (gradually increasing) beginning the next day.  Wearing a good support bra or sports bra minimizes pain and swelling.  You may have sexual intercourse when it is comfortable. You may drive when you no longer are taking prescription pain medication, you can comfortably wear a seatbelt, and you can safely maneuver your car and apply brakes. RETURN TO WORK:  ______________________________________________________________________________________ Dennis Bast should see your doctor in the office for a follow-up appointment approximately two weeks after your surgery.  Your doctor's nurse will typically make your follow-up appointment when she calls you with your pathology report.  Expect your pathology report 2-3 business days after your surgery.  You may call to check if you do not hear from [place] after three days. OTHER INSTRUCTIONS: _______________________________________________________________________________________________ _____________________________________________________________________________________________________________________________________ _____________________________________________________________________________________________________________________________________ _____________________________________________________________________________________________________________________________________  WHEN TO CALL YOUR DOCTOR: Fever over 101.0 Nausea and/or vomiting. Extreme swelling or bruising. Continued bleeding from incision. Increased pain, redness, or drainage from the incision.  The clinic staff is available to answer your questions during regular business hours.  Please don't hesitate to call and ask to speak to one of the nurses for clinical concerns.  If you have a medical emergency, go to the nearest emergency room or call 911.  A surgeon from [facility] is always on call at the hospital.   May take Tylenol after 2:30 pm, if needed.   Post Anesthesia Home Care Instructions  Activity: Get plenty of rest for the  remainder of the day. A responsible individual must stay with you for 24 hours following the procedure.  For the next 24 hours, DO NOT: -Drive a car -Paediatric nurse -Drink alcoholic beverages -Take any medication unless instructed by your physician -Make any legal decisions or sign important papers.  Meals: Start with liquid foods such as gelatin or soup. Progress to regular foods as tolerated. Avoid greasy, spicy, heavy foods. If nausea and/or vomiting occur, drink only clear liquids until the nausea and/or vomiting subsides. Call your physician if vomiting continues.  Special Instructions/Symptoms: Your throat may feel dry or sore from the anesthesia or the breathing tube placed in your throat during surgery. If this causes discomfort, gargle with warm salt water. The discomfort should disappear within 24 hours.  If you had a scopolamine patch placed behind your ear for the management of post- operative nausea and/or vomiting:  1. The medication in the patch is effective for 72 hours, after which it should be removed.  Wrap patch in a tissue and discard in the trash. Wash hands thoroughly with soap and water. 2. You may remove the patch earlier than 72 hours if you experience unpleasant side effects which may include dry mouth, dizziness or visual disturbances. 3. Avoid touching the patch. Wash your hands with soap and water after contact with the patch.   For further questions, please visit [email or web address]

## 2022-04-05 NOTE — Op Note (Signed)
Preoperative diagnosis: Left breast atypical ductal hyperplasia lower outer quadrant  Postoperative diagnosis: Same  Procedure: Left breast seed localized lumpectomy  Surgeon: Erroll Luna, MD  Anesthesia: LMA with 0.25% Marcaine with epinephrine  EBL: Minimal  Specimen: Left breast tissue with seed and clip verified by Faxitron  Indications for procedure: The patient is a 67 year old female who was found to have a mammographic abnormality involving her left breast.  Core biopsy showed atypical ductal hyperplasia.  Risk benefits surgery reviewed as well as the rationale for excision and potential upgrade risk.The procedure has been discussed with the patient. Alternatives to surgery have been discussed with the patient.  Risks of surgery include bleeding,  Infection,  Seroma formation, death,  and the need for further surgery.   The patient understands and wishes to proceed.    Description of procedure: The patient was met in the holding and questions were answered.  She had seed placement as an outpatient.  Risk and benefits were reviewed again and she agreed to proceed.  Patient was placed supine upon the OR table.  After induction of general esthesia, left breast was prepped and draped in sterile fashion and a timeout was performed.  The probe identify the seed left breast lower outer quadrant and films were available for review.  Local anesthetic of 0.25% Marcaine with epinephrine was infiltrated over the signal.  A curvilinear incision was made over the left breast lower outer quadrant.  Dissection was carried down all tissue and the seed and clip were excised with grossly negative margin.  Hemostasis achieved with cautery.  Irrigation used.  Local anesthetic infiltrated deep tissue layers approximately 3-0 Vicryl.  4 Monocryl was used to close the skin in a subcuticular fashion.  Dermabond was applied.  Breast binder placed.  All counts were found to be correct patient was awoke extubated  taken to recovery in satisfactory condition.

## 2022-04-05 NOTE — H&P (Signed)
History of Present Illness: Elizabeth Fitzpatrick is a 67 y.o. female who is seen today as an office consultation for evaluation of New Consultation (Lt atypical ductal hyperplasia bordering on low grade ductal carcinoma)  Patient presents for evaluation of abnormal left mammogram. Patient went screening mammogram followed by diagnostic mammography showing a density in the left breast lower outer quadrant core biopsy proven to be atypical ductal hyperplasia but there was some concern this may be DCIS. She presents for discussion of this today. She does have a family history of breast cancer. Denies any breast pain, nipple discharge or breast mass.   Review of Systems: A complete review of systems was obtained from the patient. I have reviewed this information and discussed as appropriate with the patient. See HPI as well for other ROS.    Medical History: Past Medical History:  Diagnosis Date  Diabetes mellitus without complication (CMS-HCC)  GERD (gastroesophageal reflux disease)  Hyperlipidemia  Hypertension  Thyroid disease   There is no problem list on file for this patient.  Past Surgical History:  Procedure Laterality Date  REMOVAL OVARIAN CYST  TONSILLECTOMY    Allergies  Allergen Reactions  Sulfa (Sulfonamide Antibiotics) Hives and Swelling  Lisinopril Other (See Comments)  Bradycardia   Current Outpatient Medications on File Prior to Visit  Medication Sig Dispense Refill  amLODIPine (NORVASC) 10 MG tablet Take 1 tablet by mouth once daily  levothyroxine (SYNTHROID) 75 MCG tablet Take by mouth  simvastatin (ZOCOR) 20 MG tablet Take 20 mg by mouth at bedtime  calcium-vitamin D3-vitamin K (CALCIUM FOR WOMEN) 500-100-40 mg-unit-mcg Chew Take by mouth   No current facility-administered medications on file prior to visit.   Family History  Problem Relation Age of Onset  Heart valve disease Mother  High blood pressure (Hypertension) Mother  Skin cancer Mother  Skin cancer  Father  High blood pressure (Hypertension) Father  Skin cancer Sister    Social History   Tobacco Use  Smoking Status Never  Smokeless Tobacco Never    Social History   Socioeconomic History  Marital status: Single  Tobacco Use  Smoking status: Never  Smokeless tobacco: Never  Substance and Sexual Activity  Alcohol use: Never  Drug use: Never   Objective:   Vitals:  03/05/22 0919 03/05/22 0924  BP: 136/76  Pulse: 75  Temp: 36.7 C (98.1 F)  SpO2: 97%  Weight: 88.5 kg (195 lb)  Height: 170.2 cm ('5\' 7"'$ )  PainSc: 0-No pain   Body mass index is 30.54 kg/m.  Physical Exam Exam conducted with a chaperone present.  HENT:  Head: Normocephalic.  Cardiovascular:  Rate and Rhythm: Normal rate.  Pulmonary:  Effort: Pulmonary effort is normal.  Breath sounds: No stridor.  Chest:  Breasts: Right: Normal. No mass or nipple discharge.  Left: No mass or nipple discharge.   Comments: Noted left breast lower outer quadrant Skin: General: Skin is warm.  Neurological:  General: No focal deficit present.  Mental Status: She is alert.  Psychiatric:  Mood and Affect: Mood normal.     Labs, Imaging and Diagnostic Testing: Diagnosis Breast, left, needle core biopsy, 4 o'clock, 3 cmfn - - ATYPICAL DUCTAL HYPERPLASIA BORDERING ON LOW-GRADE DUCTAL CARCINOMA IN SITU. NOTE: THE LESION CONSISTS OF WELL-CIRCUMSCRIBED NODULE OF ADENOSIS WITH FOCAL ROMAN BRIDGING AND RARE CRIBRIFORM ARCHITECTURE WITH CLONAL/MONOMORPHIC APPEARING NUCLEI OF LOW-GRADE (1/3) APPEARANCE. CK5/6 IS LOST IN THE AREAS OF DUCTAL PROLIFERATION AND ER IS STRONGLY UNIVERSALLY POSITIVE IN ALL OF THE "MONOMORPHIC" APPEARING CELLS. THE  OVERALL LESION IS APPROACHING 3 MM IN SIZE. DR. Donneta Romberg HAS PEER REVIEWED THE CASE AND AGREES WITH THE INTERPRETATION. [facility] [place] WAS INFORMED OF THE DIAGNOSIS ON 02/22/2022. Tilford Pillar DO Pathologist, Electronic Signature  Assessment and Plan:    Diagnoses and all orders for this visit:  Atypical ductal hyperplasia of left breast - Ambulatory Referral to Cancer Genetics [id number]  Breast cancer screening, high risk patient   Recommend left breast seed lumpectomy due to atypical ductal hyperplasia as well as possibility of DCIS. Risks and benefits of the procedure reviewed as well as rationale. Complications reviewed.The procedure has been discussed with the patient. Alternatives to surgery have been discussed with the patient. Risks of surgery include bleeding, Infection, Seroma formation, death, and the need for further surgery. The patient understands and wishes to proceed.  Genetics recommended   Kennieth Francois, MD

## 2022-04-05 NOTE — Anesthesia Postprocedure Evaluation (Signed)
Anesthesia Post Note  Patient: Elizabeth Fitzpatrick  Procedure(s) Performed: LEFT BREAST LUMPECTOMY WITH RADIOACTIVE SEED LOCALIZATION (Left: Breast)     Patient location during evaluation: PACU Anesthesia Type: General Level of consciousness: awake Pain management: pain level controlled Vital Signs Assessment: post-procedure vital signs reviewed and stable Respiratory status: spontaneous breathing, nonlabored ventilation and respiratory function stable Cardiovascular status: blood pressure returned to baseline and stable Postop Assessment: no apparent nausea or vomiting Anesthetic complications: no   No notable events documented.  Last Vitals:  Vitals:   04/05/22 1045 04/05/22 1121  BP: 127/74 (!) 146/70  Pulse: (!) 59 68  Resp: (!) 22 16  Temp:  36.7 C  SpO2: 96% 98%    Last Pain:  Vitals:   04/05/22 1121  TempSrc:   PainSc: 0-No pain                 Severo Beber P Lamir Racca

## 2022-04-05 NOTE — Anesthesia Preprocedure Evaluation (Addendum)
Anesthesia Evaluation  Patient identified by MRN, date of birth, ID band Patient awake    Reviewed: Allergy & Precautions, NPO status , Patient's Chart, lab work & pertinent test results  Airway Mallampati: II  TM Distance: >3 FB Neck ROM: Full    Dental no notable dental hx.    Pulmonary neg pulmonary ROS   Pulmonary exam normal        Cardiovascular hypertension, Pt. on medications Normal cardiovascular exam     Neuro/Psych negative neurological ROS  negative psych ROS   GI/Hepatic Neg liver ROS,,,  Endo/Other  Hypothyroidism    Renal/GU negative Renal ROS     Musculoskeletal  (+) Arthritis ,    Abdominal  (+) + obese  Peds  Hematology negative hematology ROS (+)   Anesthesia Other Findings LEFT BREAST ATYPICAL DUCTAL HYPERPLASIA  Reproductive/Obstetrics                             Anesthesia Physical Anesthesia Plan  ASA: 2  Anesthesia Plan: General   Post-op Pain Management:    Induction: Intravenous  PONV Risk Score and Plan: 3 and Ondansetron, Dexamethasone, Midazolam and Treatment may vary due to age or medical condition  Airway Management Planned: LMA  Additional Equipment:   Intra-op Plan:   Post-operative Plan: Extubation in OR  Informed Consent: I have reviewed the patients History and Physical, chart, labs and discussed the procedure including the risks, benefits and alternatives for the proposed anesthesia with the patient or authorized representative who has indicated his/her understanding and acceptance.     Dental advisory given  Plan Discussed with: CRNA  Anesthesia Plan Comments:         Anesthesia Quick Evaluation

## 2022-04-06 ENCOUNTER — Encounter (HOSPITAL_BASED_OUTPATIENT_CLINIC_OR_DEPARTMENT_OTHER): Payer: Self-pay | Admitting: Surgery

## 2022-04-10 ENCOUNTER — Encounter: Payer: Self-pay | Admitting: Surgery

## 2022-04-10 LAB — SURGICAL PATHOLOGY

## 2022-04-19 ENCOUNTER — Telehealth: Payer: Self-pay | Admitting: Radiation Oncology

## 2022-04-19 ENCOUNTER — Other Ambulatory Visit: Payer: Self-pay | Admitting: *Deleted

## 2022-04-19 DIAGNOSIS — D0512 Intraductal carcinoma in situ of left breast: Secondary | ICD-10-CM | POA: Insufficient documentation

## 2022-04-19 NOTE — Telephone Encounter (Signed)
Pt returned call, was able to schedule CON with Dr. Lisbeth Renshaw based on pt's availability and preference.

## 2022-04-19 NOTE — Telephone Encounter (Signed)
LVM to schedule CON with Dr. Moody 

## 2022-04-26 ENCOUNTER — Encounter (HOSPITAL_COMMUNITY): Payer: Self-pay

## 2022-04-26 NOTE — Progress Notes (Signed)
Radiation Oncology         ([phone]) [phone] ________________________________  Name: Elizabeth Fitzpatrick        MRN: [id number]  Date of Service: 05/03/2022 DOB: [birth year]  WU:[id number], Luberta Robertson, MD  Harriette Bouillon, MD     REFERRING PHYSICIAN: Harriette Bouillon, MD   DIAGNOSIS: The encounter diagnosis was Ductal carcinoma in situ (DCIS) of left breast.   HISTORY OF PRESENT ILLNESS: Elizabeth Fitzpatrick is a 67 y.o. female seen at the request of Dr. Luisa Hart for a new diagnosis of left breast cancer. The patient was noted to have a screening detected mass in the left breast.  She returns for ultrasound on 02/15/2022 which showed a 4 mm mass in the 4 o'clock position of the left breast and no axillary adenopathy.  She underwent a biopsy on 02/20/2022 that showed atypical ductal hyperplasia bordering on low-grade DCIS and the lesion consisted of a well-circumscribed nodule adenosis with focal changes to suspect low-grade disease.  She was counseled on excision and underwent a left lumpectomy on 04/05/2022 which showed focal DCIS that was low-grade measuring 2.1 cm in greatest dimension.  Her margins were negative, and her cancer was ER/PR positive.  She is seen to discuss adjuvant radiation.    PREVIOUS RADIATION THERAPY: No   PAST MEDICAL HISTORY:  Past Medical History:  Diagnosis Date   Arthritis    GERD (gastroesophageal reflux disease)    Hyperlipidemia    Hypertension    Hypothyroidism    Prediabetes        PAST SURGICAL HISTORY: Past Surgical History:  Procedure Laterality Date   BREAST BIOPSY Left 02/20/2022   [place] LT BREAST BX W LOC DEV 1ST LESION IMG BX SPEC US GUIDE 02/20/2022 [facility]   BREAST BIOPSY  04/04/2022   MM LT RADIOACTIVE SEED LOC MAMMO GUIDE 04/04/2022 [facility]   BREAST LUMPECTOMY WITH RADIOACTIVE SEED LOCALIZATION Left 04/05/2022   Procedure: LEFT BREAST LUMPECTOMY WITH RADIOACTIVE SEED LOCALIZATION;  Surgeon: Harriette Bouillon, MD;  Location: [facility];  Service: General;  Laterality: Left;   COLONOSCOPY     OVARIAN CYST REMOVAL     pilonidal cyst removal  1980s   TONSILLECTOMY       FAMILY HISTORY:  Family History  Problem Relation Age of Onset   Breast cancer Maternal Aunt    Breast cancer Maternal Aunt    Colon cancer Maternal Grandmother    Colon polyps Neg Hx    Esophageal cancer Neg Hx    Rectal cancer Neg Hx    Stomach cancer Neg Hx      SOCIAL HISTORY:  reports that she has never smoked. She has never used smokeless tobacco. She reports that she does not drink alcohol and does not use drugs.The patient is single and lives in [place]. The patient is retired from working in Audiological scientist at [employer].    ALLERGIES: Sulfa antibiotics and Lisinopril   MEDICATIONS:  Current Outpatient Medications  Medication Sig Dispense Refill   amLODipine (NORVASC) 10 MG tablet TAKE 1 TABLET (10 MG TOTAL) BY MOUTH DAILY. 90 tablet 3   Calcium-Vitamin D-Vitamin K 500-100-40 MG-UNT-MCG CHEW Chew 2 tablets by mouth daily.     COVID-19 mRNA vaccine 2023-2024 (COMIRNATY) syringe Inject into the muscle. 0.3 mL 0   levothyroxine (SYNTHROID) 75 MCG tablet TAKE 1 TABLET (75 MCG TOTAL) BY MOUTH DAILY. 90 tablet 3   Multiple Vitamin (MULTIVITAMINS PO) Take 1 tablet by mouth daily.     oxyCODONE (OXY  IR/ROXICODONE) 5 MG immediate release tablet Take 1 tablet (5 mg total) by mouth every 6 (six) hours as needed for severe pain. 15 tablet 0   simvastatin (ZOCOR) 20 MG tablet TAKE 1 TABLET (20 MG TOTAL) BY MOUTH AT BEDTIME. 90 tablet 3   No current facility-administered medications for this visit.     REVIEW OF SYSTEMS: On review of systems, the patient reports that she is doing well since surgery without redness or difficulties with her incision site. No other complaints are verbalized.      PHYSICAL EXAM:  Wt Readings from Last 3 Encounters:  04/05/22 195 lb 5.2 oz (88.6 kg)  01/23/22 201 lb (91.2 kg)  01/13/21 182 lb 4 oz (82.7 kg)    Temp Readings from Last 3 Encounters:  04/05/22 98 F (36.7 C)  01/23/22 98.9 F (37.2 C) (Oral)  01/13/21 97.7 F (36.5 C) (Temporal)   BP Readings from Last 3 Encounters:  04/05/22 (!) 146/70  01/23/22 120/80  01/13/21 132/74   Pulse Readings from Last 3 Encounters:  04/05/22 68  01/23/22 67  01/13/21 71    In general this is a well appearing caucasian female in no acute distress. She's alert and oriented x4 and appropriate throughout the examination. Cardiopulmonary assessment is negative for acute distress and she exhibits normal effort.  Chest reveals a well-healed surgical incision site without erythema, separation, or drainage.   ECOG = 0  0 - Asymptomatic (Fully active, able to carry on all predisease activities without restriction)  1 - Symptomatic but completely ambulatory (Restricted in physically strenuous activity but ambulatory and able to carry out work of a light or sedentary nature. For example, light housework, office work)  2 - Symptomatic, <50% in bed during the day (Ambulatory and capable of all self care but unable to carry out any work activities. Up and about more than 50% of waking hours)  3 - Symptomatic, >50% in bed, but not bedbound (Capable of only limited self-care, confined to bed or chair 50% or more of waking hours)  4 - Bedbound (Completely disabled. Cannot carry on any self-care. Totally confined to bed or chair)  5 - Death   Santiago Gladken MM, Creech RH, Tormey DC, et al. [phone](1982). "Toxicity and response criteria of the [facility] Cooperative Oncology Group". Am. Evlyn ClinesJ. Clin. Oncol. 5 (6): 649-55    LABORATORY DATA:  Lab Results  Component Value Date   WBC 6.8 01/09/2019   HGB 14.7 01/09/2019   HCT 44.0 01/09/2019   MCV 94.2 01/09/2019   PLT 181.0 01/09/2019   Lab Results  Component Value Date   NA 141 01/11/2022   K 3.9 01/11/2022   CL 102 01/11/2022   CO2 32 01/11/2022   Lab Results  Component Value Date   ALT 26 01/11/2022   AST 24  01/11/2022   ALKPHOS 85 01/11/2022   BILITOT 1.0 01/11/2022      RADIOGRAPHY: MM Breast Surgical Specimen  Result Date: 04/05/2022 CLINICAL DATA:  Evaluate specimen EXAM: SPECIMEN RADIOGRAPH OF THE LEFT BREAST COMPARISON:  Previous exam(s). FINDINGS: Status post excision of the left breast. The radioactive seed and biopsy marker clip are present, completely intact, and were marked for pathology. IMPRESSION: Specimen radiograph of the left breast. Electronically Signed   By: Gerome Samavid  Williams III M.D.   On: 04/05/2022 10:04  MM LT RADIOACTIVE SEED LOC MAMMO GUIDE  Result Date: 04/04/2022 CLINICAL DATA:  Pre surgical excision localization of recently diagnosed atypical ductal hyperplasia bordering on low-grade DCIS  in the 4 o'clock position of the left breast, marked with a coil shaped biopsy marker clip. EXAM: MAMMOGRAPHIC GUIDED RADIOACTIVE SEED LOCALIZATION OF THE LEFT BREAST COMPARISON:  Previous exam(s). FINDINGS: Patient presents for radioactive seed localization prior to left breast excision. I met with the patient and we discussed the procedure of seed localization including benefits and alternatives. We discussed the high likelihood of a successful procedure. We discussed the risks of the procedure including infection, bleeding, tissue injury and further surgery. We discussed the low dose of radioactivity involved in the procedure. Informed, written consent was given. The usual time-out protocol was performed immediately prior to the procedure. Using mammographic guidance, sterile technique, 1% lidocaine and an I-125 radioactive seed, the recently placed coil shaped biopsy marker clip in the 4 o'clock position of the left breast was localized using a lateral approach. The follow-up mammogram images demonstrate the seed 9 mm inferior to the coil shaped clip and were marked for Dr. Luisa Hartornett. Follow-up survey of the patient confirms presence of the radioactive seed. Order number of I-125 seed:   [id number]. Total activity:  0.262 mCi reference Date: 02/28/2022 The patient tolerated the procedure well and was [facility] from the [facility]. She was given instructions regarding seed removal. IMPRESSION: Radioactive seed localization left breast with the seed located 9 mm inferior to the coil shaped clip in the lateral projection. No apparent complications. Electronically Signed   By: Beckie SaltsSteven  Reid M.D.   On: 04/04/2022 15:38      IMPRESSION/PLAN: 1. Low Grade, ER/PR Positive DCIS of the Left breast. Dr. Mitzi HansenMoody discusses the final surgical pathology findings and the work up she's had to date. He reviews the nature of noninvasive breast disease. She has done well since surgery and  Dr. Mitzi HansenMoody discusses the rationale for external radiotherapy to the breast  to reduce risks of local recurrence followed by antiestrogen therapy. We discussed the risks, benefits, short, and long term effects of radiotherapy, as well as the curative intent, and the patient is interested in proceeding. Dr. Mitzi HansenMoody discusses the delivery and logistics of radiotherapy and anticipates a course of 4 weeks of radiotherapy to the left breast with deep inspiration breath-hold technique. Written consent is obtained and placed in the chart, a copy was provided to the patient. The patient will be contacted to coordinate treatment planning by our simulation department. 2. Possible genetic predisposition to malignancy. The patient is a candidate for genetic testing given her personal and family history and met with genetics this week. With her sister's recent diagnosis as well she [facility] and genetics is already aware of this.  In a visit lasting 60 minutes, greater than 50% of the time was spent face to face reviewing her case, as well as in preparation of, discussing, and coordinating the patient's care.  The above documentation reflects my direct findings during this shared patient visit.  Please see the separate note by Dr. Mitzi HansenMoody on this date for the remainder of the patient's plan of care.    Osker MasonAlison C. Tihanna Goodson, [facility]    **Disclaimer: This note was dictated with voice recognition software. Similar sounding words can inadvertently be transcribed and this note may contain transcription errors which may not have been corrected upon publication of note.**

## 2022-05-01 ENCOUNTER — Ambulatory Visit: Payer: Medicare Other

## 2022-05-01 ENCOUNTER — Encounter: Payer: Medicare Other | Admitting: Genetic Counselor

## 2022-05-01 ENCOUNTER — Ambulatory Visit: Payer: Medicare Other | Admitting: Radiation Oncology

## 2022-05-01 ENCOUNTER — Other Ambulatory Visit: Payer: Medicare Other

## 2022-05-02 ENCOUNTER — Other Ambulatory Visit: Payer: Self-pay

## 2022-05-02 ENCOUNTER — Inpatient Hospital Stay: Payer: Medicare Other

## 2022-05-02 ENCOUNTER — Other Ambulatory Visit: Payer: Self-pay | Admitting: Genetic Counselor

## 2022-05-02 ENCOUNTER — Inpatient Hospital Stay: Payer: Medicare Other | Attending: Genetic Counselor | Admitting: Genetic Counselor

## 2022-05-02 ENCOUNTER — Encounter: Payer: Self-pay | Admitting: Genetic Counselor

## 2022-05-02 DIAGNOSIS — E039 Hypothyroidism, unspecified: Secondary | ICD-10-CM | POA: Insufficient documentation

## 2022-05-02 DIAGNOSIS — Z170 Estrogen receptor positive status [ER+]: Secondary | ICD-10-CM | POA: Insufficient documentation

## 2022-05-02 DIAGNOSIS — D0512 Intraductal carcinoma in situ of left breast: Secondary | ICD-10-CM | POA: Diagnosis not present

## 2022-05-02 DIAGNOSIS — E785 Hyperlipidemia, unspecified: Secondary | ICD-10-CM | POA: Insufficient documentation

## 2022-05-02 DIAGNOSIS — Z800 Family history of malignant neoplasm of digestive organs: Secondary | ICD-10-CM | POA: Diagnosis not present

## 2022-05-02 DIAGNOSIS — Z79899 Other long term (current) drug therapy: Secondary | ICD-10-CM | POA: Insufficient documentation

## 2022-05-02 DIAGNOSIS — Z803 Family history of malignant neoplasm of breast: Secondary | ICD-10-CM | POA: Diagnosis not present

## 2022-05-02 DIAGNOSIS — M129 Arthropathy, unspecified: Secondary | ICD-10-CM | POA: Insufficient documentation

## 2022-05-02 DIAGNOSIS — I10 Essential (primary) hypertension: Secondary | ICD-10-CM | POA: Insufficient documentation

## 2022-05-02 DIAGNOSIS — K219 Gastro-esophageal reflux disease without esophagitis: Secondary | ICD-10-CM | POA: Insufficient documentation

## 2022-05-02 DIAGNOSIS — Z923 Personal history of irradiation: Secondary | ICD-10-CM | POA: Insufficient documentation

## 2022-05-02 LAB — GENETIC SCREENING ORDER

## 2022-05-02 NOTE — Progress Notes (Signed)
REFERRING PROVIDER: Harriette Bouillon, MD [address] [place],  [place] [address]  PRIMARY PROVIDER:  Excell Seltzer, MD  PRIMARY REASON FOR VISIT:  1. Family history of breast cancer   2. Family history of colon cancer   3. Ductal carcinoma in situ (DCIS) of left breast      HISTORY OF PRESENT ILLNESS:   Elizabeth Fitzpatrick, a 67 y.o. female, was seen for a [facility] cancer genetics consultation at the request of Dr. Luisa Hart due to a personal and family history of breast cancer.  Elizabeth Fitzpatrick presents to clinic today to discuss the possibility of a hereditary predisposition to cancer, genetic testing, and to further clarify her future cancer risks, as well as potential cancer risks for family members.   In January 2024, at the age of 42, Elizabeth Fitzpatrick was diagnosed with DCIS of the left breast. The treatment plan lumpectomy.     CANCER HISTORY:  Oncology History   No history exists.     RISK FACTORS:  Menarche was at age 79.  First live birth at age N/A.  OCP use for approximately 3 years.  Ovaries intact: yes.  Hysterectomy: no.  Menopausal status: postmenopausal.  HRT use: 0 years. Colonoscopy: yes; normal. Mammogram within the last year: yes. Number of breast biopsies: 0. Up to date with pelvic exams: yes. Any excessive radiation exposure in the past: no  Past Medical History:  Diagnosis Date   Arthritis    Family history of breast cancer    Family history of colon cancer    GERD (gastroesophageal reflux disease)    Hyperlipidemia    Hypertension    Hypothyroidism    Prediabetes     Past Surgical History:  Procedure Laterality Date   BREAST BIOPSY Left 02/20/2022   [place] LT BREAST BX W LOC DEV 1ST LESION IMG BX SPEC US GUIDE 02/20/2022 [facility]   BREAST BIOPSY  04/04/2022   MM LT RADIOACTIVE SEED LOC MAMMO GUIDE 04/04/2022 [facility]   BREAST LUMPECTOMY WITH RADIOACTIVE SEED LOCALIZATION Left 04/05/2022   Procedure: LEFT BREAST LUMPECTOMY WITH  RADIOACTIVE SEED LOCALIZATION;  Surgeon: Harriette Bouillon, MD;  Location: [facility];  Service: General;  Laterality: Left;   COLONOSCOPY     OVARIAN CYST REMOVAL     pilonidal cyst removal  1980s   TONSILLECTOMY      Social History   Socioeconomic History   Marital status: Single    Spouse name: Not on file   Number of children: 0   Years of education: Not on file   Highest education level: Not on file  Occupational History   Occupation: Quarry manager: [employer]  Tobacco Use   Smoking status: Never   Smokeless tobacco: Never  Substance and Sexual Activity   Alcohol use: No    Alcohol/week: 0.0 standard drinks of alcohol   Drug use: No   Sexual activity: Not on file  Other Topics Concern   Not on file  Social History Narrative   Not on file   Social Determinants of Health   Financial Resource Strain: Not on file  Food Insecurity: Not on file  Transportation Needs: Not on file  Physical Activity: Not on file  Stress: Not on file  Social Connections: Not on file     FAMILY HISTORY:  We obtained a detailed, 4-generation family history.  Significant diagnoses are listed below: Family History  Problem Relation Age of Onset  Breast cancer Sister 1567   Breast cancer Maternal Aunt        > 50   Colon cancer Maternal Aunt 45   Breast cancer Maternal Aunt        late 60s   Colon cancer Maternal Grandmother    Colon polyps Neg Hx    Esophageal cancer Neg Hx    Rectal cancer Neg Hx    Stomach cancer Neg Hx      The patient does not have children.  She has a sister who was recently diagnosed with breast cancer at 1567.  Both parents are deceased.  The patient's mother had 12 siblings.  One sister had breast and colon cancer, another sister had breast cancer.  The maternal grandmother had colon cancer.  The patient's father had a sister who was cancer free.  The paternal grandparents are deceased.  Elizabeth Fitzpatrick is unaware of previous  family history of genetic testing for hereditary cancer risks. Patient's maternal ancestors are of [place] and [place] descent, and paternal ancestors are of [place], [place] and [place] descent. There is no reported Ashkenazi Jewish ancestry. There is no known consanguinity.  GENETIC COUNSELING ASSESSMENT: Elizabeth Fitzpatrick is a 67 y.o. female with a personal and family history of breast cancer which is somewhat suggestive of a hereditary cancer syndrome and predisposition to cancer given the number of women in the family with breast cancer. We, therefore, discussed and recommended the following at today's visit.   DISCUSSION: We discussed that, in general, most cancer is not inherited in families, but instead is sporadic or familial. Sporadic cancers occur by chance and typically happen at older ages (>50 years) as this type of cancer is caused by genetic changes acquired during an individual's lifetime. Some families have more cancers than would be expected by chance; however, the ages or types of cancer are not consistent with a known genetic mutation or known genetic mutations have been ruled out. This type of familial cancer is thought to be due to a combination of multiple genetic, environmental, hormonal, and lifestyle factors. While this combination of factors likely increases the risk of cancer, the exact source of this risk is not currently identifiable or testable.  We discussed that 5 - 10% of breast cancer is hereditary, with most cases associated with BRCA mutations.  There are other genes that can be associated with hereditary breast cancer syndromes.  These include ATM, CHEK2 and PALB2.  We discussed that testing is beneficial for several reasons including knowing how to follow individuals after completing their treatment, identifying whether potential treatment options such as PARP inhibitors would be beneficial, and understand if other family members could be at risk for cancer and allow them to  undergo genetic testing.   We reviewed the characteristics, features and inheritance patterns of hereditary cancer syndromes. We also discussed genetic testing, including the appropriate family members to test, the process of testing, insurance coverage and turn-around-time for results. We discussed the implications of a negative, positive, carrier and/or variant of uncertain significant result. Elizabeth Fitzpatrick  was offered a common hereditary cancer panel (47 genes) and an expanded pan-cancer panel (77 genes). Elizabeth Fitzpatrick was informed of the benefits and limitations of each panel, including that expanded pan-cancer panels contain genes that do not have clear management guidelines at this point in time.  We also discussed that as the number of genes included on a panel increases, the chances of variants of uncertain significance increases. Elizabeth Fitzpatrick decided to pursue genetic  testing for the Multi-cancer gene panel.   The Multi-Cancer + RNA Panel offered by [facility] includes sequencing and/or deletion/duplication analysis of the following 70 genes:  AIP*, ALK, APC*, ATM*, AXIN2*, BAP1*, BARD1*, BLM*, BMPR1A*, BRCA1*, BRCA2*, BRIP1*, CDC73*, CDH1*, CDK4, CDKN1B*, CDKN2A, CHEK2*, CTNNA1*, DICER1*, EPCAM (del/dup only), EGFR, FH*, FLCN*, GREM1 (promoter dup only), HOXB13, KIT, LZTR1, MAX*, MBD4, MEN1*, MET, MITF, MLH1*, MSH2*, MSH3*, MSH6*, MUTYH*, NF1*, NF2*, NTHL1*, PALB2*, PDGFRA, PMS2*, POLD1*, POLE*, POT1*, PRKAR1A*, PTCH1*, PTEN*, RAD51C*, RAD51D*, RB1*, RET, SDHA* (sequencing only), SDHAF2*, SDHB*, SDHC*, SDHD*, SMAD4*, SMARCA4*, SMARCB1*, SMARCE1*, STK11*, SUFU*, TMEM127*, TP53*, TSC1*, TSC2*, VHL*. RNA analysis is performed for * genes.   Based on Ms. Winward's personal and family history of cancer, she meets medical criteria for genetic testing. Despite that she meets criteria, she may still have an out of pocket cost. We discussed that if her out of pocket cost for testing is over $100, the laboratory will call  and confirm whether she wants to proceed with testing.  If the out of pocket cost of testing is less than $100 she will be billed by the genetic testing laboratory.   PLAN: After considering the risks, benefits, and limitations, Ms. Mandell provided informed consent to pursue genetic testing and the blood sample was sent to [facility] for analysis of the Multi-cancer panel+RNA. Results should be available within approximately 2-3 weeks' time, at which point they will be disclosed by telephone to Ms. Aubry, as will any additional recommendations warranted by these results. Ms. Scianna will receive a summary of her genetic counseling visit and a copy of her results once available. This information will also be available in Epic.   Lastly, we encouraged Ms. Camino to remain in contact with cancer genetics annually so that we can continuously update the family history and inform her of any changes in cancer genetics and testing that may be of benefit for this family.   Ms. Huie questions were answered to her satisfaction today. Our contact information was provided should additional questions or concerns arise. Thank you for the referral and allowing [place] to share in the care of your patient.   Kayden Hutmacher P. Lowell Guitar, MS, [facility] Licensed, Patent attorney [email or web address] phone: [phone]  The patient was seen for a total of 35 minutes in face-to-face genetic counseling.  The patient was seen alone.  Drs. Meliton Rattan, and/or Otho were available for questions, if needed..    _______________________________________________________________________ For Office Staff:  Number of people involved in session: 1 Was an Intern/ student involved with case: no

## 2022-05-02 NOTE — Progress Notes (Signed)
New Breast Cancer Diagnosis: Left Breast LOQ  Did patient present with symptoms (if so, please note symptoms) or screening mammography?:Screening Mass    Location and Extent of disease :left breast. Located at 4 o'clock position, measured  4 mm in greatest dimension. Adenopathy no.  Histology per Pathology Report: grade 1, atypical ductal hyperplasia bordering on low-grade DCIS. 04/05/2022  Receptor Status: ER(positive), PR (positive), Her2-neu (), Ki-(%)  Surgeon and surgical plan, if any:  Dr. Luisa Hart - Left Breast Lumpectomy with radioactive seed localization 04/05/2022   Medical oncologist, treatment if any:   Dr. Al Pimple 05/03/2022 Recommendation: 1. Breast conserving surgery 2. Followed by adjuvant radiation therapy 3. Followed by antiestrogen therapy with tamoxifen/aromatase inhibitors based on menopausal status 5 years  Family History of Breast/Ovarian/Prostate Cancer: Sister had breast, 2 maternal Aunts had breast cancer.   Lymphedema issues, if any:  No    Pain issues, if any: No    SAFETY ISSUES: Prior radiation? no Pacemaker/ICD? No Possible current pregnancy? Postmenopausal Is the patient on methotrexate? No  Current Complaints / other details:

## 2022-05-03 ENCOUNTER — Encounter: Payer: Self-pay | Admitting: Radiation Oncology

## 2022-05-03 ENCOUNTER — Ambulatory Visit
Admission: RE | Admit: 2022-05-03 | Discharge: 2022-05-03 | Disposition: A | Discharge status: Home / Self Care | Payer: Medicare Other | Source: Ambulatory Visit | Attending: Radiation Oncology | Admitting: Radiation Oncology

## 2022-05-03 ENCOUNTER — Inpatient Hospital Stay: Payer: Medicare Other

## 2022-05-03 ENCOUNTER — Inpatient Hospital Stay (HOSPITAL_BASED_OUTPATIENT_CLINIC_OR_DEPARTMENT_OTHER): Payer: Medicare Other | Admitting: Hematology and Oncology

## 2022-05-03 VITALS — BP 148/68 | HR 78 | Temp 98.4°F | Resp 18 | Ht 67.0 in | Wt 196.1 lb

## 2022-05-03 DIAGNOSIS — K219 Gastro-esophageal reflux disease without esophagitis: Secondary | ICD-10-CM | POA: Insufficient documentation

## 2022-05-03 DIAGNOSIS — E785 Hyperlipidemia, unspecified: Secondary | ICD-10-CM | POA: Insufficient documentation

## 2022-05-03 DIAGNOSIS — D0512 Intraductal carcinoma in situ of left breast: Secondary | ICD-10-CM | POA: Diagnosis not present

## 2022-05-03 DIAGNOSIS — Z170 Estrogen receptor positive status [ER+]: Secondary | ICD-10-CM | POA: Insufficient documentation

## 2022-05-03 DIAGNOSIS — I10 Essential (primary) hypertension: Secondary | ICD-10-CM | POA: Insufficient documentation

## 2022-05-03 DIAGNOSIS — Z923 Personal history of irradiation: Secondary | ICD-10-CM | POA: Insufficient documentation

## 2022-05-03 DIAGNOSIS — Z800 Family history of malignant neoplasm of digestive organs: Secondary | ICD-10-CM

## 2022-05-03 DIAGNOSIS — Z79899 Other long term (current) drug therapy: Secondary | ICD-10-CM | POA: Insufficient documentation

## 2022-05-03 DIAGNOSIS — Z803 Family history of malignant neoplasm of breast: Secondary | ICD-10-CM | POA: Insufficient documentation

## 2022-05-03 DIAGNOSIS — M129 Arthropathy, unspecified: Secondary | ICD-10-CM | POA: Insufficient documentation

## 2022-05-03 DIAGNOSIS — E039 Hypothyroidism, unspecified: Secondary | ICD-10-CM | POA: Insufficient documentation

## 2022-05-03 NOTE — Progress Notes (Signed)
[facility] CONSULT NOTE  Patient Care Team: Excell SeltzerBedsole, Amy E, MD as PCP - General (Family Medicine) Dorena CookeyHayes, John, MD (Inactive) as Consulting Physician (Gastroenterology) SwazilandJordan, Amy, MD as Consulting Physician (Dermatology) Amado NashAlmquist, Candace GallusSusan E, MD as Consulting Physician (Obstetrics and Gynecology)  CHIEF COMPLAINTS/PURPOSE OF CONSULTATION:  Newly diagnosed breast cancer  HISTORY OF PRESENTING ILLNESS:  Elizabeth Fitzpatrick 67 y.o. female is here because of recent diagnosis of left breast DCIS  I reviewed her records extensively and collaborated the history with the patient.  SUMMARY OF ONCOLOGIC HISTORY: Oncology History  Ductal carcinoma in situ (DCIS) of left breast  02/15/2022 Mammogram   She had a screening mammogram and was recalled for a left breast mass.  She then had further investigation, targeted ultrasound which showed a 4 x 4 x 4 mm oval mass left breast 4 o'clock position 3 cm from the nipple felt to correspond with mammographically identified mass.  No left axillary adenopathy   04/05/2022 Pathology Results   Left breast lumpectomy showed focal low-grade DCIS, cribriform type without necrosis, negative for invasive carcinoma, DCIS measured 2.1 mm in greatest linear extent margins free.  Prognostic showed ER 100% positive strong staining PR 100% positive strong staining   04/19/2022 Initial Diagnosis   Ductal carcinoma in situ (DCIS) of left breast    History of Birth control pills, less than 5 yrs. No HRT She is nulliparous She has well controlled HTN, hypothyroidism. FH of breast cancer in two maternal aunts, sister at 4267, recently diagnosed. 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 She personally never smoked. Rest of the pertinent 10 point ROS reviewed and neg.  MEDICAL HISTORY:  Past Medical History:  Diagnosis Date   Arthritis    Family history of breast cancer    Family history of colon cancer    GERD (gastroesophageal reflux  disease)    Hyperlipidemia    Hypertension    Hypothyroidism    Prediabetes     SURGICAL HISTORY: Past Surgical History:  Procedure Laterality Date   BREAST BIOPSY Left 02/20/2022   US LT BREAST BX W LOC DEV 1ST LESION IMG BX SPEC US GUIDE 02/20/2022 [facility]   BREAST BIOPSY  04/04/2022   MM LT RADIOACTIVE SEED LOC MAMMO GUIDE 04/04/2022 [facility]   BREAST LUMPECTOMY WITH RADIOACTIVE SEED LOCALIZATION Left 04/05/2022   Procedure: LEFT BREAST LUMPECTOMY WITH RADIOACTIVE SEED LOCALIZATION;  Surgeon: Harriette Bouillonornett, Thomas, MD;  Location: [facility];  Service: General;  Laterality: Left;   COLONOSCOPY     OVARIAN CYST REMOVAL     pilonidal cyst removal  1980s   TONSILLECTOMY      SOCIAL HISTORY: Social History   Socioeconomic History   Marital status: Single    Spouse name: Not on file   Number of children: 0   Years of education: Not on file   Highest education level: Not on file  Occupational History   Occupation: Quarry managerAccounting clerk    Employer: [employer]  Tobacco Use   Smoking status: Never   Smokeless tobacco: Never  Substance and Sexual Activity   Alcohol use: No    Alcohol/week: 0.0 standard drinks of alcohol   Drug use: No   Sexual activity: Not on file  Other Topics Concern   Not on file  Social History Narrative   Not on file   Social Determinants of Health   Financial Resource Strain: Not on file  Food Insecurity: Not on file  Transportation Needs: Not on file  Physical Activity: Not on file  Stress: Not on file  Social Connections: Not on file  Intimate Partner Violence: Not on file    FAMILY HISTORY: Family History  Problem Relation Age of Onset   Breast cancer Sister 37   Breast cancer Maternal Aunt        > 50   Colon cancer Maternal Aunt 45   Breast cancer Maternal Aunt        late 60s   Colon cancer Maternal Grandmother    Colon polyps Neg Hx    Esophageal cancer Neg Hx    Rectal cancer Neg Hx    Stomach  cancer Neg Hx     ALLERGIES:  is allergic to sulfa antibiotics and lisinopril.  MEDICATIONS:  Current Outpatient Medications  Medication Sig Dispense Refill   amLODipine (NORVASC) 10 MG tablet TAKE 1 TABLET (10 MG TOTAL) BY MOUTH DAILY. 90 tablet 3   Calcium-Vitamin D-Vitamin K 500-100-40 MG-UNT-MCG CHEW Chew 2 tablets by mouth daily.     COVID-19 mRNA vaccine 2023-2024 (COMIRNATY) syringe Inject into the muscle. 0.3 mL 0   levothyroxine (SYNTHROID) 75 MCG tablet TAKE 1 TABLET (75 MCG TOTAL) BY MOUTH DAILY. 90 tablet 3   Multiple Vitamin (MULTIVITAMINS PO) Take 1 tablet by mouth daily.     oxyCODONE (OXY IR/ROXICODONE) 5 MG immediate release tablet Take 1 tablet (5 mg total) by mouth every 6 (six) hours as needed for severe pain. 15 tablet 0   simvastatin (ZOCOR) 20 MG tablet TAKE 1 TABLET (20 MG TOTAL) BY MOUTH AT BEDTIME. 90 tablet 3   No current facility-administered medications for this visit.    REVIEW OF SYSTEMS:   Constitutional: Denies fevers, chills or abnormal night sweats Eyes: Denies blurriness of vision, double vision or watery eyes Ears, nose, mouth, throat, and face: Denies mucositis or sore throat Respiratory: Denies cough, dyspnea or wheezes Cardiovascular: Denies palpitation, chest discomfort or lower extremity swelling Gastrointestinal:  Denies nausea, heartburn or change in bowel habits Skin: Denies abnormal skin rashes Lymphatics: Denies new lymphadenopathy or easy bruising Neurological:Denies numbness, tingling or new weaknesses Behavioral/Psych: Mood is stable, no new changes  Breast: Denies any palpable lumps or discharge All other systems were reviewed with the patient and are negative.  PHYSICAL EXAMINATION: ECOG PERFORMANCE STATUS: 0 - Asymptomatic  Vitals:   05/03/22 0958  BP: (!) 148/68  Pulse: 78  Resp: 18  Temp: 98.4 F (36.9 C)  SpO2: 99%   Filed Weights   05/03/22 0958  Weight: 196 lb 1.6 oz (89 kg)    GENERAL:alert, no distress and  comfortable Rest of the PE deferred in lieu of counseling  LABORATORY DATA:  I have reviewed the data as listed Lab Results  Component Value Date   WBC 6.8 01/09/2019   HGB 14.7 01/09/2019   HCT 44.0 01/09/2019   MCV 94.2 01/09/2019   PLT 181.0 01/09/2019   Lab Results  Component Value Date   NA 141 01/11/2022   K 3.9 01/11/2022   CL 102 01/11/2022   CO2 32 01/11/2022    RADIOGRAPHIC STUDIES: I have personally reviewed the radiological reports and agreed with the findings in the report.  ASSESSMENT AND PLAN:  Ductal carcinoma in situ (DCIS) of left breast This is a very pleasant 67 yr old menopausal female patient with newly diagnosed left breast DCIS, ER and PR positive who is referred to medical oncology for additional recommendations.  Pathology review: I discussed with the  patient the difference between DCIS and invasive breast cancer. It is considered a precancerous lesion. DCIS is classified as a Stage 0 breast cancer. It is generally detected through mammograms as calcifications. We discussed the significance of grades and its impact on prognosis. We also discussed the importance of ER and PR receptors and their implications to adjuvant treatment options. Prognosis of DCIS dependence on grade and degree of comedo necrosis. It is anticipated that if not treated, 20-30% of DCIS can develop into invasive breast cancer.  Recommendation: 1. Breast conserving surgery 2. Followed by adjuvant radiation therapy 3. Followed by antiestrogen therapy with tamoxifen/aromatase inhibitors based on menopausal status 5 years  Tamoxifen counseling: We discussed the risks and benefits of tamoxifen. These include but not limited to insomnia, hot flashes, mood changes, vaginal dryness, and weight gain. Although rare, serious side effects including endometrial cancer, risk of blood clots were also discussed. We strongly believe that the benefits far outweigh the risks. Patient understands these  risks and consented to starting treatment. Planned treatment duration is 5 years.  Aromatase inhibitors counseling: We have discussed the mechanism of action of aromatase inhibitors today.  We have discussed adverse effects including but not limited to menopausal symptoms, increased risk of osteoporosis and fractures, cardiovascular events, arthralgias and myalgias.  We do believe that the benefits far outweigh the risks.  Plan treatment duration of 5 years.  She is now post lumpectomy, and healing well. She is leaning towards aromatase inhibitors at this time. She will RTC after completing radiation.  Elizabeth Moulds MD   All questions were answered. The patient knows to call the clinic with any problems, questions or concerns.    Elizabeth Moulds, MD 05/03/22

## 2022-05-03 NOTE — Assessment & Plan Note (Signed)
This is a very pleasant 67 yr old menopausal female patient with newly diagnosed left breast DCIS, ER and PR positive who is referred to medical oncology for additional recommendations.  Pathology review: I discussed with the patient the difference between DCIS and invasive breast cancer. It is considered a precancerous lesion. DCIS is classified as a Stage 0 breast cancer. It is generally detected through mammograms as calcifications. We discussed the significance of grades and its impact on prognosis. We also discussed the importance of ER and PR receptors and their implications to adjuvant treatment options. Prognosis of DCIS dependence on grade and degree of comedo necrosis. It is anticipated that if not treated, 20-30% of DCIS can develop into invasive breast cancer.  Recommendation: 1. Breast conserving surgery 2. Followed by adjuvant radiation therapy 3. Followed by antiestrogen therapy with tamoxifen/aromatase inhibitors based on menopausal status 5 years  Tamoxifen counseling: We discussed the risks and benefits of tamoxifen. These include but not limited to insomnia, hot flashes, mood changes, vaginal dryness, and weight gain. Although rare, serious side effects including endometrial cancer, risk of blood clots were also discussed. We strongly believe that the benefits far outweigh the risks. Patient understands these risks and consented to starting treatment. Planned treatment duration is 5 years.  Aromatase inhibitors counseling: We have discussed the mechanism of action of aromatase inhibitors today.  We have discussed adverse effects including but not limited to menopausal symptoms, increased risk of osteoporosis and fractures, cardiovascular events, arthralgias and myalgias.  We do believe that the benefits far outweigh the risks.  Plan treatment duration of 5 years.  She is now post lumpectomy, and healing well. She is leaning towards aromatase inhibitors at this time. She will RTC  after completing radiation.  Rachel Moulds MD

## 2022-05-04 ENCOUNTER — Telehealth: Payer: Self-pay | Admitting: Hematology and Oncology

## 2022-05-04 ENCOUNTER — Encounter: Payer: Self-pay | Admitting: *Deleted

## 2022-05-04 NOTE — Telephone Encounter (Signed)
Spoke with patient confirming upcoming appointment  

## 2022-05-07 DIAGNOSIS — D0512 Intraductal carcinoma in situ of left breast: Secondary | ICD-10-CM | POA: Diagnosis not present

## 2022-05-07 DIAGNOSIS — Z170 Estrogen receptor positive status [ER+]: Secondary | ICD-10-CM | POA: Diagnosis not present

## 2022-05-08 ENCOUNTER — Telehealth: Payer: Self-pay | Admitting: Licensed Clinical Social Worker

## 2022-05-08 NOTE — Telephone Encounter (Signed)
[facility] Clinical Social Work  Clinical Social Work was referred by new patient protocol for assessment of psychosocial needs.  Clinical Social Worker  attempted to contact patient by phone   to offer support and assess for needs.   No answer. Left VM with direct contact information.      Quindarrius Joplin E Melitza Metheny, LCSW  Clinical Social Worker [facility]        

## 2022-05-09 ENCOUNTER — Encounter: Payer: Self-pay | Admitting: Genetic Counselor

## 2022-05-09 ENCOUNTER — Ambulatory Visit: Payer: Self-pay | Admitting: Genetic Counselor

## 2022-05-09 ENCOUNTER — Telehealth: Payer: Self-pay | Admitting: Genetic Counselor

## 2022-05-09 DIAGNOSIS — Z800 Family history of malignant neoplasm of digestive organs: Secondary | ICD-10-CM

## 2022-05-09 DIAGNOSIS — Z1379 Encounter for other screening for genetic and chromosomal anomalies: Secondary | ICD-10-CM | POA: Insufficient documentation

## 2022-05-09 DIAGNOSIS — Z803 Family history of malignant neoplasm of breast: Secondary | ICD-10-CM

## 2022-05-09 DIAGNOSIS — D0512 Intraductal carcinoma in situ of left breast: Secondary | ICD-10-CM

## 2022-05-09 NOTE — Telephone Encounter (Signed)
LM on VM taht results are back and to please call.  Left CB instructions.

## 2022-05-09 NOTE — Progress Notes (Signed)
HPI:   Elizabeth Fitzpatrick was previously seen in the [facility] due to a personal and family history of breast cancer and concerns regarding a hereditary predisposition to cancer. Please refer to our prior cancer genetics clinic note for more information regarding our discussion, assessment and recommendations, at the time. Elizabeth Fitzpatrick recent genetic test results were disclosed to her when she was at breast multidisciplinary clinic with her sister. These results and recommendations are discussed in more detail below.  CANCER HISTORY:  Oncology History  Ductal carcinoma in situ (DCIS) of left breast  02/15/2022 Mammogram   She had a screening mammogram and was recalled for a left breast mass.  She then had further investigation, targeted ultrasound which showed a 4 x 4 x 4 mm oval mass left breast 4 o'clock position 3 cm from the nipple felt to correspond with mammographically identified mass.  No left axillary adenopathy   04/05/2022 Pathology Results   Left breast lumpectomy showed focal low-grade DCIS, cribriform type without necrosis, negative for invasive carcinoma, DCIS measured 2.1 mm in greatest linear extent margins free.  Prognostic showed ER 100% positive strong staining PR 100% positive strong staining   04/19/2022 Initial Diagnosis   Ductal carcinoma in situ (DCIS) of left breast   05/08/2022 Genetic Testing   Negative genetic testing on the Multicancer + RNA panel.  The report date is May 08, 2022.  The Multi-Cancer + RNA Panel offered by [facility] includes sequencing and/or deletion/duplication analysis of the following 70 genes:  AIP*, ALK, APC*, ATM*, AXIN2*, BAP1*, BARD1*, BLM*, BMPR1A*, BRCA1*, BRCA2*, BRIP1*, CDC73*, CDH1*, CDK4, CDKN1B*, CDKN2A, CHEK2*, CTNNA1*, DICER1*, EPCAM (del/dup only), EGFR, FH*, FLCN*, GREM1 (promoter dup only), HOXB13, KIT, LZTR1, MAX*, MBD4, MEN1*, MET, MITF, MLH1*, MSH2*, MSH3*, MSH6*, MUTYH*, NF1*, NF2*, NTHL1*, PALB2*, PDGFRA, PMS2*,  POLD1*, POLE*, POT1*, PRKAR1A*, PTCH1*, PTEN*, RAD51C*, RAD51D*, RB1*, RET, SDHA* (sequencing only), SDHAF2*, SDHB*, SDHC*, SDHD*, SMAD4*, SMARCA4*, SMARCB1*, SMARCE1*, STK11*, SUFU*, TMEM127*, TP53*, TSC1*, TSC2*, VHL*. RNA analysis is performed for * genes.       FAMILY HISTORY:  We obtained a detailed, 4-generation family history.  Significant diagnoses are listed below:      Family History  Problem Relation Age of Onset   Breast cancer Sister 42   Breast cancer Maternal Aunt          > 50   Colon cancer Maternal Aunt 45   Breast cancer Maternal Aunt          late 60s   Colon cancer Maternal Grandmother     Colon polyps Neg Hx     Esophageal cancer Neg Hx     Rectal cancer Neg Hx     Stomach cancer Neg Hx         The patient does not have children.  She has a sister who was recently diagnosed with breast cancer at 81.  Both parents are deceased.   The patient's mother had 12 siblings.  One sister had breast and colon cancer, another sister had breast cancer.  The maternal grandmother had colon cancer.   The patient's father had a sister who was cancer free.  The paternal grandparents are deceased.   Elizabeth Fitzpatrick is unaware of previous family history of genetic testing for hereditary cancer risks. Patient's maternal ancestors are of [place] and [place] descent, and paternal ancestors are of [place], [place] and [place] descent. There is no reported Ashkenazi Jewish ancestry. There is no known consanguinity.    GENETIC TEST RESULTS:  The [facility] Multi-Cancer +RNA Panel found no pathogenic mutations.    The Multi-Cancer + RNA Panel offered by [facility] includes sequencing and/or deletion/duplication analysis of the following 70 genes:  AIP*, ALK, APC*, ATM*, AXIN2*, BAP1*, BARD1*, BLM*, BMPR1A*, BRCA1*, BRCA2*, BRIP1*, CDC73*, CDH1*, CDK4, CDKN1B*, CDKN2A, CHEK2*, CTNNA1*, DICER1*, EPCAM (del/dup only), EGFR, FH*, FLCN*, GREM1 (promoter dup only), HOXB13, KIT, LZTR1, MAX*, MBD4,  MEN1*, MET, MITF, MLH1*, MSH2*, MSH3*, MSH6*, MUTYH*, NF1*, NF2*, NTHL1*, PALB2*, PDGFRA, PMS2*, POLD1*, POLE*, POT1*, PRKAR1A*, PTCH1*, PTEN*, RAD51C*, RAD51D*, RB1*, RET, SDHA* (sequencing only), SDHAF2*, SDHB*, SDHC*, SDHD*, SMAD4*, SMARCA4*, SMARCB1*, SMARCE1*, STK11*, SUFU*, TMEM127*, TP53*, TSC1*, TSC2*, VHL*. RNA analysis is performed for * genes.  The test report has been scanned into EPIC and is located under the Molecular Pathology section of the Results Review tab.  A portion of the result report is included below for reference. Genetic testing reported out on May 08, 2022.       Even though a pathogenic variant was not identified, possible explanations for the cancer in the family may include: There may be no hereditary risk for cancer in the family. The cancers in Elizabeth Fitzpatrick and/or her family may be sporadic/familial or due to other genetic and environmental factors. There may be a gene mutation in one of these genes that current testing methods cannot detect but that chance is small. There could be another gene that has not yet been discovered, or that we have not yet tested, that is responsible for the cancer diagnoses in the family.  It is also possible there is a hereditary cause for the cancer in the family that Elizabeth Fitzpatrick did not inherit.   Therefore, it is important to remain in touch with cancer genetics in the future so that we can continue to offer Elizabeth Fitzpatrick the most up to date genetic testing.     ADDITIONAL GENETIC TESTING:  We discussed with [name] that her genetic testing was fairly extensive.  If there are additional relevant genes identified to increase cancer risk that can be analyzed in the future, we would be happy to discuss and coordinate this testing at that time.     CANCER SCREENING RECOMMENDATIONS:  Elizabeth Fitzpatrick's test result is considered negative (normal).  This means that we have not identified a hereditary cause for her personal history of breast  cancer at this time.   An individual's cancer risk and medical management are not determined by genetic test results alone. Overall cancer risk assessment incorporates additional factors, including personal medical history, family history, and any available genetic information that may result in a personalized plan for cancer prevention and surveillance. Therefore, it is recommended she continue to follow the cancer management and screening guidelines provided by her oncology and primary healthcare provider.  RECOMMENDATIONS FOR FAMILY MEMBERS:   Individuals in this family might be at some increased risk of developing cancer, over the general population risk, due to the family history of cancer.  Individuals in the family should notify their providers of the family history of cancer. We recommend women in this family have a yearly mammogram beginning at age 83, or 32 years younger than the earliest onset of cancer, an annual clinical breast exam, and perform monthly breast self-exams.  Other members of the family may still carry a pathogenic variant in one of these genes that Ms. Palardy did not inherit. Based on the family history, we recommended her sister, who was recently diagnosed with breast cancer, have genetic testing.  Her  results are pending.    FOLLOW-UP:  Lastly, we discussed with Ms. Innocent that cancer genetics is a rapidly advancing field and it is possible that new genetic tests will be appropriate for her and/or her family members in the future. We encouraged her to remain in contact with cancer genetics on an annual basis so we can update her personal and family histories and let her know of advances in cancer genetics that may benefit this family.   Our contact number was provided. Ms. Bohannon questions were answered to her satisfaction, and she knows she is welcome to call us at anytime with additional questions or concerns.   Edelin Fryer M. Rennie Plowman, MS, [facility] Genetic  Counselor [email or web address] (P) [phone]

## 2022-05-11 ENCOUNTER — Other Ambulatory Visit: Payer: Self-pay

## 2022-05-11 ENCOUNTER — Ambulatory Visit
Admission: RE | Admit: 2022-05-11 | Discharge: 2022-05-11 | Disposition: A | Discharge status: Home / Self Care | Payer: Medicare Other | Source: Ambulatory Visit | Attending: Radiation Oncology | Admitting: Radiation Oncology

## 2022-05-11 DIAGNOSIS — D0512 Intraductal carcinoma in situ of left breast: Secondary | ICD-10-CM | POA: Insufficient documentation

## 2022-05-11 DIAGNOSIS — Z170 Estrogen receptor positive status [ER+]: Secondary | ICD-10-CM | POA: Diagnosis not present

## 2022-05-17 ENCOUNTER — Encounter: Payer: Self-pay | Admitting: *Deleted

## 2022-05-17 DIAGNOSIS — D0512 Intraductal carcinoma in situ of left breast: Secondary | ICD-10-CM

## 2022-05-18 DIAGNOSIS — Z170 Estrogen receptor positive status [ER+]: Secondary | ICD-10-CM | POA: Diagnosis not present

## 2022-05-18 DIAGNOSIS — D0512 Intraductal carcinoma in situ of left breast: Secondary | ICD-10-CM | POA: Diagnosis not present

## 2022-05-23 ENCOUNTER — Other Ambulatory Visit: Payer: Self-pay

## 2022-05-23 ENCOUNTER — Ambulatory Visit
Admission: RE | Admit: 2022-05-23 | Discharge: 2022-05-23 | Disposition: A | Discharge status: Home / Self Care | Payer: Medicare Other | Source: Ambulatory Visit | Attending: Radiation Oncology | Admitting: Radiation Oncology

## 2022-05-23 DIAGNOSIS — D0512 Intraductal carcinoma in situ of left breast: Secondary | ICD-10-CM | POA: Insufficient documentation

## 2022-05-23 DIAGNOSIS — Z510 Encounter for antineoplastic radiation therapy: Secondary | ICD-10-CM | POA: Diagnosis not present

## 2022-05-23 DIAGNOSIS — Z170 Estrogen receptor positive status [ER+]: Secondary | ICD-10-CM | POA: Diagnosis not present

## 2022-05-23 LAB — RAD ONC ARIA SESSION SUMMARY
Course Elapsed Days: 0
Plan Fractions Treated to Date: 1
Plan Prescribed Dose Per Fraction: 2.66 Gy
Plan Total Fractions Prescribed: 16
Plan Total Prescribed Dose: 42.56 Gy
Reference Point Dosage Given to Date: 2.66 Gy
Reference Point Session Dosage Given: 2.66 Gy
Session Number: 1

## 2022-05-24 ENCOUNTER — Other Ambulatory Visit: Payer: Self-pay

## 2022-05-24 ENCOUNTER — Ambulatory Visit
Admission: RE | Admit: 2022-05-24 | Discharge: 2022-05-24 | Disposition: A | Discharge status: Home / Self Care | Payer: Medicare Other | Source: Ambulatory Visit | Attending: Radiation Oncology | Admitting: Radiation Oncology

## 2022-05-24 DIAGNOSIS — Z510 Encounter for antineoplastic radiation therapy: Secondary | ICD-10-CM | POA: Diagnosis not present

## 2022-05-24 DIAGNOSIS — Z170 Estrogen receptor positive status [ER+]: Secondary | ICD-10-CM | POA: Diagnosis not present

## 2022-05-24 DIAGNOSIS — D0512 Intraductal carcinoma in situ of left breast: Secondary | ICD-10-CM | POA: Diagnosis not present

## 2022-05-24 LAB — RAD ONC ARIA SESSION SUMMARY
Course Elapsed Days: 1
Plan Fractions Treated to Date: 2
Plan Prescribed Dose Per Fraction: 2.66 Gy
Plan Total Fractions Prescribed: 16
Plan Total Prescribed Dose: 42.56 Gy
Reference Point Dosage Given to Date: 5.32 Gy
Reference Point Session Dosage Given: 2.66 Gy
Session Number: 2

## 2022-05-25 ENCOUNTER — Other Ambulatory Visit: Payer: Self-pay

## 2022-05-25 ENCOUNTER — Ambulatory Visit
Admission: RE | Admit: 2022-05-25 | Discharge: 2022-05-25 | Disposition: A | Discharge status: Home / Self Care | Payer: Medicare Other | Source: Ambulatory Visit | Attending: Radiation Oncology | Admitting: Radiation Oncology

## 2022-05-25 DIAGNOSIS — D0512 Intraductal carcinoma in situ of left breast: Secondary | ICD-10-CM

## 2022-05-25 DIAGNOSIS — Z170 Estrogen receptor positive status [ER+]: Secondary | ICD-10-CM | POA: Diagnosis not present

## 2022-05-25 DIAGNOSIS — Z510 Encounter for antineoplastic radiation therapy: Secondary | ICD-10-CM | POA: Diagnosis not present

## 2022-05-25 LAB — RAD ONC ARIA SESSION SUMMARY
Course Elapsed Days: 2
Plan Fractions Treated to Date: 3
Plan Prescribed Dose Per Fraction: 2.66 Gy
Plan Total Fractions Prescribed: 16
Plan Total Prescribed Dose: 42.56 Gy
Reference Point Dosage Given to Date: 7.98 Gy
Reference Point Session Dosage Given: 2.66 Gy
Session Number: 3

## 2022-05-25 MED ORDER — RADIAPLEXRX EX GEL: Freq: Once | CUTANEOUS | Status: AC

## 2022-05-25 MED ORDER — ALRA NON-METALLIC DEODORANT (RAD-ONC)
1.0000 | Freq: Once | TOPICAL | Status: AC
  Administered 2022-05-25: 1 via TOPICAL

## 2022-05-28 ENCOUNTER — Other Ambulatory Visit: Payer: Self-pay

## 2022-05-28 ENCOUNTER — Ambulatory Visit
Admission: RE | Admit: 2022-05-28 | Discharge: 2022-05-28 | Disposition: A | Discharge status: Home / Self Care | Payer: Medicare Other | Source: Ambulatory Visit | Attending: Radiation Oncology | Admitting: Radiation Oncology

## 2022-05-28 DIAGNOSIS — Z510 Encounter for antineoplastic radiation therapy: Secondary | ICD-10-CM | POA: Diagnosis not present

## 2022-05-28 DIAGNOSIS — D0512 Intraductal carcinoma in situ of left breast: Secondary | ICD-10-CM | POA: Diagnosis not present

## 2022-05-28 DIAGNOSIS — Z170 Estrogen receptor positive status [ER+]: Secondary | ICD-10-CM | POA: Diagnosis not present

## 2022-05-28 LAB — RAD ONC ARIA SESSION SUMMARY
Course Elapsed Days: 5
Plan Fractions Treated to Date: 4
Plan Prescribed Dose Per Fraction: 2.66 Gy
Plan Total Fractions Prescribed: 16
Plan Total Prescribed Dose: 42.56 Gy
Reference Point Dosage Given to Date: 10.64 Gy
Reference Point Session Dosage Given: 2.66 Gy
Session Number: 4

## 2022-05-29 ENCOUNTER — Other Ambulatory Visit: Payer: Self-pay

## 2022-05-29 ENCOUNTER — Ambulatory Visit
Admission: RE | Admit: 2022-05-29 | Discharge: 2022-05-29 | Disposition: A | Discharge status: Home / Self Care | Payer: Medicare Other | Source: Ambulatory Visit | Attending: Radiation Oncology | Admitting: Radiation Oncology

## 2022-05-29 DIAGNOSIS — D0512 Intraductal carcinoma in situ of left breast: Secondary | ICD-10-CM | POA: Diagnosis not present

## 2022-05-29 DIAGNOSIS — Z170 Estrogen receptor positive status [ER+]: Secondary | ICD-10-CM | POA: Diagnosis not present

## 2022-05-29 DIAGNOSIS — Z510 Encounter for antineoplastic radiation therapy: Secondary | ICD-10-CM | POA: Diagnosis not present

## 2022-05-29 LAB — RAD ONC ARIA SESSION SUMMARY
Course Elapsed Days: 6
Plan Fractions Treated to Date: 5
Plan Prescribed Dose Per Fraction: 2.66 Gy
Plan Total Fractions Prescribed: 16
Plan Total Prescribed Dose: 42.56 Gy
Reference Point Dosage Given to Date: 13.3 Gy
Reference Point Session Dosage Given: 2.66 Gy
Session Number: 5

## 2022-05-30 ENCOUNTER — Ambulatory Visit
Admission: RE | Admit: 2022-05-30 | Discharge: 2022-05-30 | Disposition: A | Discharge status: Home / Self Care | Payer: Medicare Other | Source: Ambulatory Visit | Attending: Radiation Oncology | Admitting: Radiation Oncology

## 2022-05-30 ENCOUNTER — Other Ambulatory Visit: Payer: Self-pay

## 2022-05-30 DIAGNOSIS — Z170 Estrogen receptor positive status [ER+]: Secondary | ICD-10-CM | POA: Diagnosis not present

## 2022-05-30 DIAGNOSIS — D0512 Intraductal carcinoma in situ of left breast: Secondary | ICD-10-CM | POA: Diagnosis not present

## 2022-05-30 DIAGNOSIS — Z510 Encounter for antineoplastic radiation therapy: Secondary | ICD-10-CM | POA: Diagnosis not present

## 2022-05-30 LAB — RAD ONC ARIA SESSION SUMMARY
Course Elapsed Days: 7
Plan Fractions Treated to Date: 6
Plan Prescribed Dose Per Fraction: 2.66 Gy
Plan Total Fractions Prescribed: 16
Plan Total Prescribed Dose: 42.56 Gy
Reference Point Dosage Given to Date: 15.96 Gy
Reference Point Session Dosage Given: 2.66 Gy
Session Number: 6

## 2022-05-31 ENCOUNTER — Ambulatory Visit
Admission: RE | Admit: 2022-05-31 | Discharge: 2022-05-31 | Disposition: A | Discharge status: Home / Self Care | Payer: Medicare Other | Source: Ambulatory Visit | Attending: Radiation Oncology | Admitting: Radiation Oncology

## 2022-05-31 ENCOUNTER — Other Ambulatory Visit: Payer: Self-pay

## 2022-05-31 DIAGNOSIS — Z510 Encounter for antineoplastic radiation therapy: Secondary | ICD-10-CM | POA: Diagnosis not present

## 2022-05-31 DIAGNOSIS — Z170 Estrogen receptor positive status [ER+]: Secondary | ICD-10-CM | POA: Diagnosis not present

## 2022-05-31 DIAGNOSIS — D0512 Intraductal carcinoma in situ of left breast: Secondary | ICD-10-CM | POA: Diagnosis not present

## 2022-05-31 LAB — RAD ONC ARIA SESSION SUMMARY
Course Elapsed Days: 8
Plan Fractions Treated to Date: 7
Plan Prescribed Dose Per Fraction: 2.66 Gy
Plan Total Fractions Prescribed: 16
Plan Total Prescribed Dose: 42.56 Gy
Reference Point Dosage Given to Date: 18.62 Gy
Reference Point Session Dosage Given: 2.66 Gy
Session Number: 7

## 2022-06-01 ENCOUNTER — Ambulatory Visit
Admission: RE | Admit: 2022-06-01 | Discharge: 2022-06-01 | Disposition: A | Discharge status: Home / Self Care | Payer: Medicare Other | Source: Ambulatory Visit | Attending: Radiation Oncology | Admitting: Radiation Oncology

## 2022-06-01 ENCOUNTER — Other Ambulatory Visit: Payer: Self-pay

## 2022-06-01 DIAGNOSIS — D0512 Intraductal carcinoma in situ of left breast: Secondary | ICD-10-CM | POA: Diagnosis not present

## 2022-06-01 DIAGNOSIS — Z510 Encounter for antineoplastic radiation therapy: Secondary | ICD-10-CM | POA: Diagnosis not present

## 2022-06-01 DIAGNOSIS — Z170 Estrogen receptor positive status [ER+]: Secondary | ICD-10-CM | POA: Diagnosis not present

## 2022-06-01 LAB — RAD ONC ARIA SESSION SUMMARY
Course Elapsed Days: 9
Plan Fractions Treated to Date: 8
Plan Prescribed Dose Per Fraction: 2.66 Gy
Plan Total Fractions Prescribed: 16
Plan Total Prescribed Dose: 42.56 Gy
Reference Point Dosage Given to Date: 21.28 Gy
Reference Point Session Dosage Given: 2.66 Gy
Session Number: 8

## 2022-06-02 DIAGNOSIS — Z170 Estrogen receptor positive status [ER+]: Secondary | ICD-10-CM | POA: Diagnosis not present

## 2022-06-02 DIAGNOSIS — D0512 Intraductal carcinoma in situ of left breast: Secondary | ICD-10-CM | POA: Diagnosis not present

## 2022-06-02 DIAGNOSIS — Z510 Encounter for antineoplastic radiation therapy: Secondary | ICD-10-CM | POA: Diagnosis not present

## 2022-06-04 ENCOUNTER — Ambulatory Visit
Admission: RE | Admit: 2022-06-04 | Discharge: 2022-06-04 | Disposition: A | Discharge status: Home / Self Care | Payer: Medicare Other | Source: Ambulatory Visit | Attending: Radiation Oncology | Admitting: Radiation Oncology

## 2022-06-04 ENCOUNTER — Other Ambulatory Visit: Payer: Self-pay

## 2022-06-04 DIAGNOSIS — D0512 Intraductal carcinoma in situ of left breast: Secondary | ICD-10-CM | POA: Diagnosis not present

## 2022-06-04 DIAGNOSIS — Z510 Encounter for antineoplastic radiation therapy: Secondary | ICD-10-CM | POA: Diagnosis not present

## 2022-06-04 DIAGNOSIS — Z170 Estrogen receptor positive status [ER+]: Secondary | ICD-10-CM | POA: Diagnosis not present

## 2022-06-04 LAB — RAD ONC ARIA SESSION SUMMARY
Course Elapsed Days: 12
Plan Fractions Treated to Date: 9
Plan Prescribed Dose Per Fraction: 2.66 Gy
Plan Total Fractions Prescribed: 16
Plan Total Prescribed Dose: 42.56 Gy
Reference Point Dosage Given to Date: 23.94 Gy
Reference Point Session Dosage Given: 2.66 Gy
Session Number: 9

## 2022-06-05 ENCOUNTER — Ambulatory Visit: Payer: Medicare Other

## 2022-06-06 ENCOUNTER — Ambulatory Visit: Payer: Medicare Other

## 2022-06-07 ENCOUNTER — Ambulatory Visit
Admission: RE | Admit: 2022-06-07 | Discharge: 2022-06-07 | Disposition: A | Discharge status: Home / Self Care | Payer: Medicare Other | Source: Ambulatory Visit | Attending: Radiation Oncology | Admitting: Radiation Oncology

## 2022-06-07 ENCOUNTER — Other Ambulatory Visit: Payer: Self-pay

## 2022-06-07 DIAGNOSIS — Z170 Estrogen receptor positive status [ER+]: Secondary | ICD-10-CM | POA: Diagnosis not present

## 2022-06-07 DIAGNOSIS — D0512 Intraductal carcinoma in situ of left breast: Secondary | ICD-10-CM | POA: Diagnosis not present

## 2022-06-07 DIAGNOSIS — Z510 Encounter for antineoplastic radiation therapy: Secondary | ICD-10-CM | POA: Diagnosis not present

## 2022-06-07 LAB — RAD ONC ARIA SESSION SUMMARY
Course Elapsed Days: 15
Plan Fractions Treated to Date: 10
Plan Prescribed Dose Per Fraction: 2.66 Gy
Plan Total Fractions Prescribed: 16
Plan Total Prescribed Dose: 42.56 Gy
Reference Point Dosage Given to Date: 26.6 Gy
Reference Point Session Dosage Given: 2.66 Gy
Session Number: 10

## 2022-06-08 ENCOUNTER — Other Ambulatory Visit: Payer: Self-pay

## 2022-06-08 ENCOUNTER — Ambulatory Visit
Admission: RE | Admit: 2022-06-08 | Discharge: 2022-06-08 | Disposition: A | Discharge status: Home / Self Care | Payer: Medicare Other | Source: Ambulatory Visit | Attending: Radiation Oncology | Admitting: Radiation Oncology

## 2022-06-08 ENCOUNTER — Ambulatory Visit: Payer: Medicare Other | Admitting: Radiation Oncology

## 2022-06-08 DIAGNOSIS — Z510 Encounter for antineoplastic radiation therapy: Secondary | ICD-10-CM | POA: Diagnosis not present

## 2022-06-08 DIAGNOSIS — D0512 Intraductal carcinoma in situ of left breast: Secondary | ICD-10-CM

## 2022-06-08 DIAGNOSIS — Z170 Estrogen receptor positive status [ER+]: Secondary | ICD-10-CM | POA: Diagnosis not present

## 2022-06-08 LAB — RAD ONC ARIA SESSION SUMMARY
Course Elapsed Days: 16
Plan Fractions Treated to Date: 11
Plan Prescribed Dose Per Fraction: 2.66 Gy
Plan Total Fractions Prescribed: 16
Plan Total Prescribed Dose: 42.56 Gy
Reference Point Dosage Given to Date: 29.26 Gy
Reference Point Session Dosage Given: 2.66 Gy
Session Number: 11

## 2022-06-08 MED ORDER — RADIAPLEXRX EX GEL: Freq: Once | CUTANEOUS | Status: AC

## 2022-06-11 ENCOUNTER — Other Ambulatory Visit: Payer: Self-pay

## 2022-06-11 ENCOUNTER — Ambulatory Visit
Admission: RE | Admit: 2022-06-11 | Discharge: 2022-06-11 | Disposition: A | Discharge status: Home / Self Care | Payer: Medicare Other | Source: Ambulatory Visit | Attending: Radiation Oncology | Admitting: Radiation Oncology

## 2022-06-11 DIAGNOSIS — Z170 Estrogen receptor positive status [ER+]: Secondary | ICD-10-CM | POA: Diagnosis not present

## 2022-06-11 DIAGNOSIS — Z510 Encounter for antineoplastic radiation therapy: Secondary | ICD-10-CM | POA: Diagnosis not present

## 2022-06-11 DIAGNOSIS — D0512 Intraductal carcinoma in situ of left breast: Secondary | ICD-10-CM | POA: Diagnosis not present

## 2022-06-11 LAB — RAD ONC ARIA SESSION SUMMARY
Course Elapsed Days: 19
Plan Fractions Treated to Date: 12
Plan Prescribed Dose Per Fraction: 2.66 Gy
Plan Total Fractions Prescribed: 16
Plan Total Prescribed Dose: 42.56 Gy
Reference Point Dosage Given to Date: 31.92 Gy
Reference Point Session Dosage Given: 2.66 Gy
Session Number: 12

## 2022-06-12 ENCOUNTER — Ambulatory Visit
Admission: RE | Admit: 2022-06-12 | Discharge: 2022-06-12 | Disposition: A | Discharge status: Home / Self Care | Payer: Medicare Other | Source: Ambulatory Visit | Attending: Radiation Oncology | Admitting: Radiation Oncology

## 2022-06-12 ENCOUNTER — Other Ambulatory Visit: Payer: Self-pay

## 2022-06-12 DIAGNOSIS — Z510 Encounter for antineoplastic radiation therapy: Secondary | ICD-10-CM | POA: Diagnosis not present

## 2022-06-12 DIAGNOSIS — Z170 Estrogen receptor positive status [ER+]: Secondary | ICD-10-CM | POA: Diagnosis not present

## 2022-06-12 DIAGNOSIS — D0512 Intraductal carcinoma in situ of left breast: Secondary | ICD-10-CM | POA: Diagnosis not present

## 2022-06-12 LAB — RAD ONC ARIA SESSION SUMMARY
Course Elapsed Days: 20
Plan Fractions Treated to Date: 13
Plan Prescribed Dose Per Fraction: 2.66 Gy
Plan Total Fractions Prescribed: 16
Plan Total Prescribed Dose: 42.56 Gy
Reference Point Dosage Given to Date: 34.58 Gy
Reference Point Session Dosage Given: 2.66 Gy
Session Number: 13

## 2022-06-13 ENCOUNTER — Ambulatory Visit
Admission: RE | Admit: 2022-06-13 | Discharge: 2022-06-13 | Disposition: A | Discharge status: Home / Self Care | Payer: Medicare Other | Source: Ambulatory Visit | Attending: Radiation Oncology | Admitting: Radiation Oncology

## 2022-06-13 ENCOUNTER — Other Ambulatory Visit: Payer: Self-pay

## 2022-06-13 DIAGNOSIS — Z510 Encounter for antineoplastic radiation therapy: Secondary | ICD-10-CM | POA: Diagnosis not present

## 2022-06-13 DIAGNOSIS — D0512 Intraductal carcinoma in situ of left breast: Secondary | ICD-10-CM | POA: Diagnosis not present

## 2022-06-13 DIAGNOSIS — Z170 Estrogen receptor positive status [ER+]: Secondary | ICD-10-CM | POA: Diagnosis not present

## 2022-06-13 LAB — RAD ONC ARIA SESSION SUMMARY
Course Elapsed Days: 21
Plan Fractions Treated to Date: 14
Plan Prescribed Dose Per Fraction: 2.66 Gy
Plan Total Fractions Prescribed: 16
Plan Total Prescribed Dose: 42.56 Gy
Reference Point Dosage Given to Date: 37.24 Gy
Reference Point Session Dosage Given: 2.66 Gy
Session Number: 14

## 2022-06-14 ENCOUNTER — Other Ambulatory Visit: Payer: Self-pay

## 2022-06-14 ENCOUNTER — Ambulatory Visit
Admission: RE | Admit: 2022-06-14 | Discharge: 2022-06-14 | Disposition: A | Discharge status: Home / Self Care | Payer: Medicare Other | Source: Ambulatory Visit | Attending: Radiation Oncology | Admitting: Radiation Oncology

## 2022-06-14 DIAGNOSIS — D0512 Intraductal carcinoma in situ of left breast: Secondary | ICD-10-CM | POA: Diagnosis not present

## 2022-06-14 DIAGNOSIS — Z510 Encounter for antineoplastic radiation therapy: Secondary | ICD-10-CM | POA: Diagnosis not present

## 2022-06-14 DIAGNOSIS — Z170 Estrogen receptor positive status [ER+]: Secondary | ICD-10-CM | POA: Diagnosis not present

## 2022-06-14 LAB — RAD ONC ARIA SESSION SUMMARY
Course Elapsed Days: 22
Plan Fractions Treated to Date: 15
Plan Prescribed Dose Per Fraction: 2.66 Gy
Plan Total Fractions Prescribed: 16
Plan Total Prescribed Dose: 42.56 Gy
Reference Point Dosage Given to Date: 39.9 Gy
Reference Point Session Dosage Given: 2.66 Gy
Session Number: 15

## 2022-06-15 ENCOUNTER — Ambulatory Visit
Admission: RE | Admit: 2022-06-15 | Discharge: 2022-06-15 | Disposition: A | Discharge status: Home / Self Care | Payer: Medicare Other | Source: Ambulatory Visit | Attending: Radiation Oncology | Admitting: Radiation Oncology

## 2022-06-15 ENCOUNTER — Other Ambulatory Visit: Payer: Self-pay

## 2022-06-15 DIAGNOSIS — D0512 Intraductal carcinoma in situ of left breast: Secondary | ICD-10-CM | POA: Diagnosis not present

## 2022-06-15 DIAGNOSIS — Z170 Estrogen receptor positive status [ER+]: Secondary | ICD-10-CM | POA: Diagnosis not present

## 2022-06-15 DIAGNOSIS — Z510 Encounter for antineoplastic radiation therapy: Secondary | ICD-10-CM | POA: Diagnosis not present

## 2022-06-15 LAB — RAD ONC ARIA SESSION SUMMARY
Course Elapsed Days: 23
Plan Fractions Treated to Date: 16
Plan Prescribed Dose Per Fraction: 2.66 Gy
Plan Total Fractions Prescribed: 16
Plan Total Prescribed Dose: 42.56 Gy
Reference Point Dosage Given to Date: 42.56 Gy
Reference Point Session Dosage Given: 2.66 Gy
Session Number: 16

## 2022-06-19 ENCOUNTER — Ambulatory Visit
Admission: RE | Admit: 2022-06-19 | Discharge: 2022-06-19 | Disposition: A | Discharge status: Home / Self Care | Payer: Medicare Other | Source: Ambulatory Visit | Attending: Radiation Oncology | Admitting: Radiation Oncology

## 2022-06-19 ENCOUNTER — Other Ambulatory Visit: Payer: Self-pay

## 2022-06-19 DIAGNOSIS — D0512 Intraductal carcinoma in situ of left breast: Secondary | ICD-10-CM | POA: Diagnosis not present

## 2022-06-19 LAB — RAD ONC ARIA SESSION SUMMARY
Course Elapsed Days: 27
Plan Fractions Treated to Date: 1
Plan Prescribed Dose Per Fraction: 2 Gy
Plan Total Fractions Prescribed: 4
Plan Total Prescribed Dose: 8 Gy
Reference Point Dosage Given to Date: 2 Gy
Reference Point Session Dosage Given: 2 Gy
Session Number: 17

## 2022-06-20 ENCOUNTER — Telehealth: Payer: Self-pay

## 2022-06-20 ENCOUNTER — Ambulatory Visit
Admission: RE | Admit: 2022-06-20 | Discharge: 2022-06-20 | Disposition: A | Discharge status: Home / Self Care | Payer: Medicare Other | Source: Ambulatory Visit | Attending: Radiation Oncology | Admitting: Radiation Oncology

## 2022-06-20 ENCOUNTER — Other Ambulatory Visit: Payer: Self-pay

## 2022-06-20 DIAGNOSIS — D0512 Intraductal carcinoma in situ of left breast: Secondary | ICD-10-CM | POA: Diagnosis not present

## 2022-06-20 LAB — RAD ONC ARIA SESSION SUMMARY
Course Elapsed Days: 28
Plan Fractions Treated to Date: 2
Plan Prescribed Dose Per Fraction: 2 Gy
Plan Total Fractions Prescribed: 4
Plan Total Prescribed Dose: 8 Gy
Reference Point Dosage Given to Date: 4 Gy
Reference Point Session Dosage Given: 2 Gy
Session Number: 18

## 2022-06-20 NOTE — Telephone Encounter (Signed)
Attempted to call pt. LVM for call back  

## 2022-06-20 NOTE — Telephone Encounter (Signed)
-----   Message from Mauri Pole, RN sent at 06/20/2022  3:35 PM EDT -----  ----- Message ----- From: Dellis Filbert, LPN Sent: 1/61/0960   4:35 PM EDT To: [facility]  Please call pt to speak with her about survivorship plan that she is scheduled for in Sept

## 2022-06-21 ENCOUNTER — Other Ambulatory Visit: Payer: Self-pay

## 2022-06-21 ENCOUNTER — Telehealth: Payer: Self-pay

## 2022-06-21 ENCOUNTER — Ambulatory Visit
Admission: RE | Admit: 2022-06-21 | Discharge: 2022-06-21 | Disposition: A | Discharge status: Home / Self Care | Payer: Medicare Other | Source: Ambulatory Visit | Attending: Radiation Oncology | Admitting: Radiation Oncology

## 2022-06-21 DIAGNOSIS — D0512 Intraductal carcinoma in situ of left breast: Secondary | ICD-10-CM | POA: Diagnosis not present

## 2022-06-21 LAB — RAD ONC ARIA SESSION SUMMARY
Course Elapsed Days: 29
Plan Fractions Treated to Date: 3
Plan Prescribed Dose Per Fraction: 2 Gy
Plan Total Fractions Prescribed: 4
Plan Total Prescribed Dose: 8 Gy
Reference Point Dosage Given to Date: 6 Gy
Reference Point Session Dosage Given: 2 Gy
Session Number: 19

## 2022-06-21 NOTE — Telephone Encounter (Signed)
Pt returned call, asking for call back. Attempted to call pt back and LVM for call.

## 2022-06-22 ENCOUNTER — Inpatient Hospital Stay: Payer: Medicare Other | Attending: Genetic Counselor | Admitting: Hematology and Oncology

## 2022-06-22 ENCOUNTER — Other Ambulatory Visit: Payer: Self-pay

## 2022-06-22 ENCOUNTER — Ambulatory Visit
Admission: RE | Admit: 2022-06-22 | Discharge: 2022-06-22 | Disposition: A | Discharge status: Home / Self Care | Payer: Medicare Other | Source: Ambulatory Visit | Attending: Radiation Oncology | Admitting: Radiation Oncology

## 2022-06-22 VITALS — BP 140/70 | HR 65 | Temp 98.8°F | Resp 16 | Wt 197.2 lb

## 2022-06-22 DIAGNOSIS — Z803 Family history of malignant neoplasm of breast: Secondary | ICD-10-CM | POA: Diagnosis not present

## 2022-06-22 DIAGNOSIS — Z800 Family history of malignant neoplasm of digestive organs: Secondary | ICD-10-CM | POA: Insufficient documentation

## 2022-06-22 DIAGNOSIS — D0512 Intraductal carcinoma in situ of left breast: Secondary | ICD-10-CM | POA: Insufficient documentation

## 2022-06-22 DIAGNOSIS — Z510 Encounter for antineoplastic radiation therapy: Secondary | ICD-10-CM | POA: Diagnosis not present

## 2022-06-22 DIAGNOSIS — Z170 Estrogen receptor positive status [ER+]: Secondary | ICD-10-CM | POA: Diagnosis not present

## 2022-06-22 DIAGNOSIS — I10 Essential (primary) hypertension: Secondary | ICD-10-CM | POA: Insufficient documentation

## 2022-06-22 LAB — RAD ONC ARIA SESSION SUMMARY
Course Elapsed Days: 30
Plan Fractions Treated to Date: 4
Plan Prescribed Dose Per Fraction: 2 Gy
Plan Total Fractions Prescribed: 4
Plan Total Prescribed Dose: 8 Gy
Reference Point Dosage Given to Date: 8 Gy
Reference Point Session Dosage Given: 2 Gy
Session Number: 20

## 2022-06-22 MED ORDER — ANASTROZOLE 1 MG PO TABS: 1.0000 mg | ORAL_TABLET | Freq: Every day | ORAL | 3 refills | Status: DC

## 2022-06-22 NOTE — Progress Notes (Signed)
[facility] CONSULT NOTE  Patient Care Team: Excell Seltzer, MD as PCP - General (Family Medicine) [place], Amy, MD as Consulting Physician (Dermatology) Amado Nash Candace Gallus, MD as Consulting Physician (Obstetrics and Gynecology) Pershing Proud, RN as Oncology Nurse Navigator Donnelly Angelica, RN as Oncology Nurse Navigator Harriette Bouillon, MD as Consulting Physician (General Surgery) Rachel Moulds, MD as Consulting Physician (Hematology and Oncology) Dorothy Puffer, MD as Consulting Physician (Radiation Oncology) Napoleon Form, MD as Consulting Physician (Gastroenterology)  CHIEF COMPLAINTS/PURPOSE OF CONSULTATION:  Newly diagnosed breast cancer  HISTORY OF PRESENTING ILLNESS:  Elizabeth Fitzpatrick 67 y.o. female is here because of recent diagnosis of left breast DCIS  I reviewed her records extensively and collaborated the history with the patient.  SUMMARY OF ONCOLOGIC HISTORY: Oncology History  Ductal carcinoma in situ (DCIS) of left breast  02/15/2022 Mammogram   She had a screening mammogram and was recalled for a left breast mass.  She then had further investigation, targeted ultrasound which showed a 4 x 4 x 4 mm oval mass left breast 4 o'clock position 3 cm from the nipple felt to correspond with mammographically identified mass.  No left axillary adenopathy   04/05/2022 Pathology Results   Left breast lumpectomy showed focal low-grade DCIS, cribriform type without necrosis, negative for invasive carcinoma, DCIS measured 2.1 mm in greatest linear extent margins free.  Prognostic showed ER 100% positive strong staining PR 100% positive strong staining   04/19/2022 Initial Diagnosis   Ductal carcinoma in situ (DCIS) of left breast   05/08/2022 Genetic Testing   Negative genetic testing on the Multicancer + RNA panel.  The report date is May 08, 2022.  The Multi-Cancer + RNA Panel offered by [facility] includes sequencing and/or deletion/duplication analysis of the  following 70 genes:  AIP*, ALK, APC*, ATM*, AXIN2*, BAP1*, BARD1*, BLM*, BMPR1A*, BRCA1*, BRCA2*, BRIP1*, CDC73*, CDH1*, CDK4, CDKN1B*, CDKN2A, CHEK2*, CTNNA1*, DICER1*, EPCAM (del/dup only), EGFR, FH*, FLCN*, GREM1 (promoter dup only), HOXB13, KIT, LZTR1, MAX*, MBD4, MEN1*, MET, MITF, MLH1*, MSH2*, MSH3*, MSH6*, MUTYH*, NF1*, NF2*, NTHL1*, PALB2*, PDGFRA, PMS2*, POLD1*, POLE*, POT1*, PRKAR1A*, PTCH1*, PTEN*, RAD51C*, RAD51D*, RB1*, RET, SDHA* (sequencing only), SDHAF2*, SDHB*, SDHC*, SDHD*, SMAD4*, SMARCA4*, SMARCB1*, SMARCE1*, STK11*, SUFU*, TMEM127*, TP53*, TSC1*, TSC2*, VHL*. RNA analysis is performed for * genes.    History of Birth control pills, less than 5 yrs. No HRT She is nulliparous She has well controlled HTN, hypothyroidism. FH of breast cancer in two maternal aunts, sister at 9, recently diagnosed. 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 She personally never smoked.  She had low grade DCIS, cribriform type without necrosis,, negative for invasive carcinoma, DCIS measures 2.1 mm, negative margins, ER 100% positive strong staining PR 100% positive strong staining Genetic testing negative Rest of the pertinent 10 point ROS reviewed and neg.  MEDICAL HISTORY:  Past Medical History:  Diagnosis Date   Arthritis    Family history of breast cancer    Family history of colon cancer    GERD (gastroesophageal reflux disease)    Hyperlipidemia    Hypertension    Hypothyroidism    Prediabetes     SURGICAL HISTORY: Past Surgical History:  Procedure Laterality Date   BREAST BIOPSY Left 02/20/2022   [place] LT BREAST BX W LOC DEV 1ST LESION IMG BX SPEC US GUIDE 02/20/2022 [facility] MAMMOGRAPHY   BREAST BIOPSY  04/04/2022   MM LT RADIOACTIVE SEED LOC MAMMO GUIDE 04/04/2022 [facility] MAMMOGRAPHY   BREAST  LUMPECTOMY WITH RADIOACTIVE SEED LOCALIZATION Left 04/05/2022   Procedure: LEFT BREAST LUMPECTOMY WITH RADIOACTIVE SEED LOCALIZATION;  Surgeon: Harriette Bouillon, MD;  Location:  [facility];  Service: General;  Laterality: Left;   COLONOSCOPY     OVARIAN CYST REMOVAL     pilonidal cyst removal  1980s   TONSILLECTOMY      SOCIAL HISTORY: Social History   Socioeconomic History   Marital status: Single    Spouse name: Not on file   Number of children: 0   Years of education: Not on file   Highest education level: Not on file  Occupational History   Occupation: Quarry manager: [employer]  Tobacco Use   Smoking status: Never   Smokeless tobacco: Never  Substance and Sexual Activity   Alcohol use: No    Alcohol/week: 0.0 standard drinks of alcohol   Drug use: No   Sexual activity: Not on file  Other Topics Concern   Not on file  Social History Narrative   Not on file   Social Determinants of Health   Financial Resource Strain: Not on file  Food Insecurity: Not on file  Transportation Needs: Not on file  Physical Activity: Not on file  Stress: Not on file  Social Connections: Not on file  Intimate Partner Violence: Not on file    FAMILY HISTORY: Family History  Problem Relation Age of Onset   Breast cancer Sister 13   Breast cancer Maternal Aunt        > 50   Colon cancer Maternal Aunt 45   Breast cancer Maternal Aunt        late 60s   Colon cancer Maternal Grandmother    Colon polyps Neg Hx    Esophageal cancer Neg Hx    Rectal cancer Neg Hx    Stomach cancer Neg Hx     ALLERGIES:  is allergic to sulfa antibiotics and lisinopril.  MEDICATIONS:  Current Outpatient Medications  Medication Sig Dispense Refill   anastrozole (ARIMIDEX) 1 MG tablet Take 1 tablet (1 mg total) by mouth daily. 90 tablet 3   amLODipine (NORVASC) 10 MG tablet TAKE 1 TABLET (10 MG TOTAL) BY MOUTH DAILY. 90 tablet 3   Calcium-Vitamin D-Vitamin K 500-100-40 MG-UNT-MCG CHEW Chew 2 tablets by mouth daily.     COVID-19 mRNA vaccine 2023-2024 (COMIRNATY) syringe Inject into the muscle. 0.3 mL 0   levothyroxine (SYNTHROID) 75 MCG  tablet TAKE 1 TABLET (75 MCG TOTAL) BY MOUTH DAILY. 90 tablet 3   Multiple Vitamin (MULTIVITAMINS PO) Take 1 tablet by mouth daily.     simvastatin (ZOCOR) 20 MG tablet TAKE 1 TABLET (20 MG TOTAL) BY MOUTH AT BEDTIME. 90 tablet 3   No current facility-administered medications for this visit.    REVIEW OF SYSTEMS:   Constitutional: Denies fevers, chills or abnormal night sweats Eyes: Denies blurriness of vision, double vision or watery eyes Ears, nose, mouth, throat, and face: Denies mucositis or sore throat Respiratory: Denies cough, dyspnea or wheezes Cardiovascular: Denies palpitation, chest discomfort or lower extremity swelling Gastrointestinal:  Denies nausea, heartburn or change in bowel habits Skin: Denies abnormal skin rashes Lymphatics: Denies new lymphadenopathy or easy bruising Neurological:Denies numbness, tingling or new weaknesses Behavioral/Psych: Mood is stable, no new changes  Breast: Denies any palpable lumps or discharge All other systems were reviewed with the patient and are negative.  PHYSICAL EXAMINATION: ECOG PERFORMANCE STATUS: 0 - Asymptomatic  Vitals:   06/22/22 1324  BP: (!) 140/70  Pulse: 65  Resp: 16  Temp: 98.8 F (37.1 C)  SpO2: 98%   Filed Weights   06/22/22 1324  Weight: 197 lb 3.2 oz (89.4 kg)    GENERAL:alert, no distress and comfortable Rest of the PE deferred in lieu of counseling  LABORATORY DATA:  I have reviewed the data as listed Lab Results  Component Value Date   WBC 6.8 01/09/2019   HGB 14.7 01/09/2019   HCT 44.0 01/09/2019   MCV 94.2 01/09/2019   PLT 181.0 01/09/2019   Lab Results  Component Value Date   NA 141 01/11/2022   K 3.9 01/11/2022   CL 102 01/11/2022   CO2 32 01/11/2022    RADIOGRAPHIC STUDIES: I have personally reviewed the radiological reports and agreed with the findings in the report.  ASSESSMENT AND PLAN:  Ductal carcinoma in situ (DCIS) of left breast This is a very pleasant 67 yr old  menopausal female patient with newly diagnosed left breast DCIS, ER and PR positive who is referred to medical oncology for additional recommendations.  She is status postlumpectomy with negative margins and adjuvant radiation.  We have once again reviewed the risk factors for breast cancer.  She wondered if she can skip the antiestrogen therapy since her genetic testing is negative.  We have reviewed the importance of antiestrogen therapy despite negative genetic since most of her breast cancers today have not caused by genetic abnormalities.  We have reviewed modifying some of her risk factors including diet and staying active, lifestyle modification etc.  She will try anastrozole.  We have once again discussed about mechanism of action of antiestrogen therapy, adverse effects of antiestrogen therapy including but not limited to postmenopausal symptoms such as hot flashes, vaginal dryness, arthralgias, bone density loss etc.  Her last bone density back in 2021 was normal.  She is agreeable to this plan.  She is already scheduled for survivorship visit in September.   All questions were answered. The patient knows to call the clinic with any problems, questions or concerns.    Rachel Moulds, MD 06/22/22

## 2022-06-22 NOTE — Assessment & Plan Note (Signed)
This is a very pleasant 67 yr old menopausal female patient with newly diagnosed left breast DCIS, ER and PR positive who is referred to medical oncology for additional recommendations.  She is status postlumpectomy with negative margins and adjuvant radiation.  We have once again reviewed the risk factors for breast cancer.  She wondered if she can skip the antiestrogen therapy since her genetic testing is negative.  We have reviewed the importance of antiestrogen therapy despite negative genetic since most of her breast cancers today have not caused by genetic abnormalities.  We have reviewed modifying some of her risk factors including diet and staying active, lifestyle modification etc.  She will try anastrozole.  We have once again discussed about mechanism of action of antiestrogen therapy, adverse effects of antiestrogen therapy including but not limited to postmenopausal symptoms such as hot flashes, vaginal dryness, arthralgias, bone density loss etc.  Her last bone density back in 2021 was normal.  She is agreeable to this plan.  She is already scheduled for survivorship visit in September.

## 2022-06-27 NOTE — Radiation Completion Notes (Addendum)
  Radiation Oncology         [phone] ________________________________  Name: Elizabeth Fitzpatrick MRN: [id number]  Date of Service: 06/22/2022  DOB: [birth year]  End of Treatment Note   Diagnosis:  Low Grade, ER/PR Positive DCIS of the Left breast   Intent: Curative     ==========DELIVERED PLANS==========  First Treatment Date: 2022-05-23 - Last Treatment Date: 2022-06-22   Plan Name: Breast_L_BH Site: Breast, Left Technique: 3D Mode: Photon Dose Per Fraction: 2.66 Gy Prescribed Dose (Delivered / Prescribed): 42.56 Gy / 42.56 Gy Prescribed Fxs (Delivered / Prescribed): 16 / 16   Plan Name: Brst_L_Bst_BH Site: Breast, Left Technique: 3D Mode: Photon Dose Per Fraction: 2 Gy Prescribed Dose (Delivered / Prescribed): 8 Gy / 8 Gy Prescribed Fxs (Delivered / Prescribed): 4 / 4     ==========ON TREATMENT VISIT DATES========== 2022-05-25, 2022-06-01, 2022-06-08, 2022-06-15    See weekly On Treatment Notes in Epic for details.The patient tolerated radiation. She developed fatigue and anticipated skin changes in the treatment field.   The patient will receive a call in about one month from the radiation oncology department. Se will continue follow up with Dr. Al Pimple as well.      Osker Mason, PAC

## 2022-06-29 ENCOUNTER — Encounter: Payer: Self-pay | Admitting: *Deleted

## 2022-06-30 ENCOUNTER — Telehealth: Payer: Self-pay | Admitting: Hematology and Oncology

## 2022-07-04 ENCOUNTER — Telehealth: Payer: Self-pay

## 2022-07-04 NOTE — Telephone Encounter (Signed)
Returned Pt's call regarding bone density scan. Pt states next availability for bone scan at [facility] is in December and wanted to verify that it was OK to start anti-estrogen therapy. LVM that Pt should start rx and that scan can wait until December per MD. Gave call back number with any questions.

## 2022-07-05 ENCOUNTER — Telehealth: Payer: Self-pay | Admitting: Hematology and Oncology

## 2022-08-28 ENCOUNTER — Ambulatory Visit
Admission: RE | Admit: 2022-08-28 | Discharge: 2022-08-28 | Disposition: A | Discharge status: Home / Self Care | Payer: Medicare Other | Source: Ambulatory Visit | Attending: Radiation Oncology | Admitting: Radiation Oncology

## 2022-08-28 NOTE — Progress Notes (Signed)
  Radiation Oncology         [phone] ________________________________  Name: Elizabeth Fitzpatrick MRN: [id number]  Date of Service: 08/28/2022  DOB: 05-02-1955  Post Treatment Telephone Note  Diagnosis:  Low Grade, ER/PR Positive DCIS of the Left breast (as documented in provider EOT note)  The patient was not available for call today.   The patient was encouraged to avoid sun exposure in the area of prior treatment for up to one year following radiation with either sunscreen or by the style of clothing worn in the sun.  The patient has scheduled follow up with her medical oncologist Dr. Al Pimple  for ongoing surveillance, and was encouraged to call if she develops concerns or questions regarding radiation.    Ruel Favors, LPN

## 2022-09-03 DIAGNOSIS — D0512 Intraductal carcinoma in situ of left breast: Secondary | ICD-10-CM | POA: Diagnosis not present

## 2022-09-26 ENCOUNTER — Inpatient Hospital Stay: Payer: Medicare Other | Attending: Adult Health | Admitting: Adult Health

## 2022-09-26 ENCOUNTER — Encounter: Payer: Self-pay | Admitting: Adult Health

## 2022-09-26 VITALS — BP 140/68 | HR 61 | Temp 98.7°F | Resp 18 | Ht 67.0 in | Wt 193.8 lb

## 2022-09-26 DIAGNOSIS — Z13820 Encounter for screening for osteoporosis: Secondary | ICD-10-CM

## 2022-09-26 DIAGNOSIS — Z803 Family history of malignant neoplasm of breast: Secondary | ICD-10-CM | POA: Diagnosis not present

## 2022-09-26 DIAGNOSIS — Z79811 Long term (current) use of aromatase inhibitors: Secondary | ICD-10-CM | POA: Diagnosis not present

## 2022-09-26 DIAGNOSIS — Z923 Personal history of irradiation: Secondary | ICD-10-CM | POA: Diagnosis not present

## 2022-09-26 DIAGNOSIS — Z800 Family history of malignant neoplasm of digestive organs: Secondary | ICD-10-CM | POA: Insufficient documentation

## 2022-09-26 DIAGNOSIS — D0512 Intraductal carcinoma in situ of left breast: Secondary | ICD-10-CM | POA: Diagnosis not present

## 2022-09-26 NOTE — Progress Notes (Signed)
SURVIVORSHIP VISIT:  BRIEF ONCOLOGIC HISTORY:  Oncology History  Ductal carcinoma in situ (DCIS) of left breast  02/15/2022 Mammogram   She had a screening mammogram and was recalled for a left breast mass.  She then had further investigation, targeted ultrasound which showed a 4 x 4 x 4 mm oval mass left breast 4 o'clock position 3 cm from the nipple felt to correspond with mammographically identified mass.  No left axillary adenopathy   04/05/2022 Pathology Results   Left breast lumpectomy showed focal low-grade DCIS, cribriform type without necrosis, negative for invasive carcinoma, DCIS measured 2.1 mm in greatest linear extent margins free.  Prognostic showed ER 100% positive strong staining PR 100% positive strong staining   05/08/2022 Genetic Testing   Negative genetic testing on the Multicancer + RNA panel.  The report date is May 08, 2022.  The Multi-Cancer + RNA Panel offered by [facility] includes sequencing and/or deletion/duplication analysis of the following 70 genes:  AIP*, ALK, APC*, ATM*, AXIN2*, BAP1*, BARD1*, BLM*, BMPR1A*, BRCA1*, BRCA2*, BRIP1*, CDC73*, CDH1*, CDK4, CDKN1B*, CDKN2A, CHEK2*, CTNNA1*, DICER1*, EPCAM (del/dup only), EGFR, FH*, FLCN*, GREM1 (promoter dup only), HOXB13, KIT, LZTR1, MAX*, MBD4, MEN1*, MET, MITF, MLH1*, MSH2*, MSH3*, MSH6*, MUTYH*, NF1*, NF2*, NTHL1*, PALB2*, PDGFRA, PMS2*, POLD1*, POLE*, POT1*, PRKAR1A*, PTCH1*, PTEN*, RAD51C*, RAD51D*, RB1*, RET, SDHA* (sequencing only), SDHAF2*, SDHB*, SDHC*, SDHD*, SMAD4*, SMARCA4*, SMARCB1*, SMARCE1*, STK11*, SUFU*, TMEM127*, TP53*, TSC1*, TSC2*, VHL*. RNA analysis is performed for * genes.   05/23/2022 - 06/22/2022 Radiation Therapy   Plan Name: Breast_L_BH Site: Breast, Left Technique: 3D Mode: Photon Dose Per Fraction: 2.66 Gy Prescribed Dose (Delivered / Prescribed): 42.56 Gy / 42.56 Gy Prescribed Fxs (Delivered / Prescribed): 16 / 16   Plan Name: Brst_L_Bst_BH Site: Breast, Left Technique: 3D Mode:  Photon Dose Per Fraction: 2 Gy Prescribed Dose (Delivered / Prescribed): 8 Gy / 8 Gy Prescribed Fxs (Delivered / Prescribed): 4 / 4   06/2022 -  Anti-estrogen oral therapy   Anastrozole   09/26/2022 Cancer Staging   Staging form: Breast, AJCC 8th Edition - Pathologic: Stage 0 (pTis (DCIS), pN0, cM0, ER+, PR+) - Signed by Loa Socks, NP on 09/26/2022 Stage prefix: Initial diagnosis     INTERVAL HISTORY:  Elizabeth Fitzpatrick to review her survivorship care plan detailing her treatment course for breast cancer, as well as monitoring long-term side effects of that treatment, education regarding health maintenance, screening, and overall wellness and health promotion.     Overall, Elizabeth Fitzpatrick reports feeling quite well.  She is taking anastrozole daily.  She toelrates this moderately well.  She experiences a hot flash that occurs intermittently.  These are tolerable for her  REVIEW OF SYSTEMS:  Review of Systems  Constitutional:  Negative for appetite change, chills, fatigue, fever and unexpected weight change.  HENT:   Negative for hearing loss, lump/mass and trouble swallowing.   Eyes:  Negative for eye problems and icterus.  Respiratory:  Negative for chest tightness, cough and shortness of breath.   Cardiovascular:  Negative for chest pain, leg swelling and palpitations.  Gastrointestinal:  Negative for abdominal distention, abdominal pain, constipation, diarrhea, nausea and vomiting.  Endocrine: Positive for hot flashes.  Genitourinary:  Negative for difficulty urinating.   Musculoskeletal:  Negative for arthralgias.  Skin:  Negative for itching and rash.  Neurological:  Negative for dizziness, extremity weakness, headaches and numbness.  Hematological:  Negative for adenopathy. Does not bruise/bleed easily.  Psychiatric/Behavioral:  Negative for depression. The patient is not nervous/anxious.  Breast: Denies any new nodularity, masses, tenderness, nipple changes, or nipple  discharge.       PAST MEDICAL/SURGICAL HISTORY:  Past Medical History:  Diagnosis Date   Arthritis    Family history of breast cancer    Family history of colon cancer    GERD (gastroesophageal reflux disease)    Hyperlipidemia    Hypertension    Hypothyroidism    Prediabetes    Past Surgical History:  Procedure Laterality Date   BREAST BIOPSY Left 02/20/2022   [place] LT BREAST BX W LOC DEV 1ST LESION IMG BX SPEC US GUIDE 02/20/2022 [facility]   BREAST BIOPSY  04/04/2022   MM LT RADIOACTIVE SEED LOC MAMMO GUIDE 04/04/2022 [facility]MO[facility]   BREAST LUMPECTOMY WITH RADIOACTIVE SEED LOCALIZATION Left 04/05/2022   Procedure: LEFT BREAST LUMPECTOMY WITH RADIOACTIVE SEED LOCALIZATION;  Surgeon: Harriette Bouillon, MD;  Location: [facility];  Service: General;  Laterality: Left;   COLONOSCOPY     OVARIAN CYST REMOVAL     pilonidal cyst removal  1980s   TONSILLECTOMY       ALLERGIES:  Allergies  Allergen Reactions   Sulfa Antibiotics Hives and Swelling   Lisinopril     Bradycardia      CURRENT MEDICATIONS:  Outpatient Encounter Medications as of 09/26/2022  Medication Sig   amLODipine (NORVASC) 10 MG tablet TAKE 1 TABLET (10 MG TOTAL) BY MOUTH DAILY.   anastrozole (ARIMIDEX) 1 MG tablet Take 1 tablet (1 mg total) by mouth daily.   Calcium-Vitamin D-Vitamin K 500-100-40 MG-UNT-MCG CHEW Chew 2 tablets by mouth daily.   COVID-19 mRNA vaccine 2023-2024 (COMIRNATY) syringe Inject into the muscle.   levothyroxine (SYNTHROID) 75 MCG tablet TAKE 1 TABLET (75 MCG TOTAL) BY MOUTH DAILY.   Multiple Vitamin (MULTIVITAMINS PO) Take 1 tablet by mouth daily.   simvastatin (ZOCOR) 20 MG tablet TAKE 1 TABLET (20 MG TOTAL) BY MOUTH AT BEDTIME.   No facility-administered encounter medications on file as of 09/26/2022.     ONCOLOGIC FAMILY HISTORY:  Family History  Problem Relation Age of Onset   Breast cancer Sister 71   Breast cancer Maternal Aunt        > 50   Colon  cancer Maternal Aunt 45   Breast cancer Maternal Aunt        late 60s   Colon cancer Maternal Grandmother    Colon polyps Neg Hx    Esophageal cancer Neg Hx    Rectal cancer Neg Hx    Stomach cancer Neg Hx      SOCIAL HISTORY:  Social History   Socioeconomic History   Marital status: Single    Spouse name: Not on file   Number of children: 0   Years of education: Not on file   Highest education level: Not on file  Occupational History   Occupation: Quarry manager: [employer]  Tobacco Use   Smoking status: Never   Smokeless tobacco: Never  Substance and Sexual Activity   Alcohol use: No    Alcohol/week: 0.0 standard drinks of alcohol   Drug use: No   Sexual activity: Not on file  Other Topics Concern   Not on file  Social History Narrative   Not on file   Social Determinants of Health   Financial Resource Strain: Not on file  Food Insecurity: Not on file  Transportation Needs: Not on file  Physical Activity: Not on file  Stress: Not on file  Social  Connections: Not on file  Intimate Partner Violence: Not on file     OBSERVATIONS/OBJECTIVE:  BP (!) 140/68 (BP Location: Right Arm, Patient Position: Sitting)   Pulse 61   Temp 98.7 F (37.1 C) (Tympanic)   Resp 18   Ht 5\' 7"  (1.702 m)   Wt 193 lb 12.8 oz (87.9 kg)   SpO2 100%   BMI 30.35 kg/m  GENERAL: Patient is a well appearing female in no acute distress HEENT:  Sclerae anicteric.  Oropharynx clear and moist. No ulcerations or evidence of oropharyngeal candidiasis. Neck is supple.  NODES:  No cervical, supraclavicular, or axillary lymphadenopathy palpated.  BREAST EXAM: Left breast status postlumpectomy and radiation no sign of local recurrence right breast is benign. LUNGS:  Clear to auscultation bilaterally.  No wheezes or rhonchi. HEART:  Regular rate and rhythm. No murmur appreciated. ABDOMEN:  Soft, nontender.  Positive, normoactive bowel sounds. No organomegaly palpated. MSK:  No  focal spinal tenderness to palpation. Full range of motion bilaterally in the upper extremities. EXTREMITIES:  No peripheral edema.   SKIN:  Clear with no obvious rashes or skin changes. No nail dyscrasia. NEURO:  Nonfocal. Well oriented.  Appropriate affect.   LABORATORY DATA:  None for this visit.  DIAGNOSTIC IMAGING:  None for this visit.      ASSESSMENT AND PLAN:  Ms.. Fitzpatrick is a pleasant 67 y.o. female with Stage 0 left breast DCIS, ER+/PR+, diagnosed in 01/2022, treated with lumpectomy, adjuvant radiation therapy, and anti-estrogen therapy with Anastrozole beginning in 09/2022.  She presents to the Survivorship Clinic for our initial meeting and routine follow-up post-completion of treatment for breast cancer.    1. Stage 0 left breast cancer:  Elizabeth Fitzpatrick is continuing to recover from definitive treatment for breast cancer. She will follow-up with her medical oncologist, Dr. Al Pimple with history and physical exam per surveillance protocol.  She will continue her anti-estrogen therapy with Anastrozole. Thus far, she is tolerating the Anastrozole well, with minimal side effects. Her mammogram is due 01/2023; orders placed today.   Today, a comprehensive survivorship care plan and treatment summary was reviewed with the patient today detailing her breast cancer diagnosis, treatment course, potential late/long-term effects of treatment, appropriate follow-up care with recommendations for the future, and patient education resources.  A copy of this summary, along with a letter will be sent to the patient's primary care provider via mail/fax/In Basket message after today's visit.    2. Bone health:  Given Elizabeth Fitzpatrick's age/history of breast cancer and her current treatment regimen including anti-estrogen therapy with Anastrozole, she is at risk for bone demineralization.  Her next bone density is scheduled for 01/2023.  She was frustrated about this being scheduled so far out therefore I changed the order  to occur at [facility] which she is happy to go to.  She was given education on specific activities to promote bone health.  3. Cancer screening:  Due to Elizabeth Fitzpatrick's history and her age, she should receive screening for skin cancers, colon cancer, and gynecologic cancers.  The information and recommendations are listed on the patient's comprehensive care plan/treatment summary and were reviewed in detail with the patient.    4. Health maintenance and wellness promotion: Elizabeth Fitzpatrick was encouraged to consume 5-7 servings of fruits and vegetables per day. We reviewed the "Nutrition Rainbow" handout.  She was also encouraged to engage in moderate to vigorous exercise for 30 minutes per day most days of the week.  She  was instructed to limit her alcohol consumption and continue to abstain from tobacco use/ng.     5. Support services/counseling: It is not uncommon for this period of the patient's cancer care trajectory to be one of many emotions and stressors.   She was given information regarding our available services and encouraged to contact me with any questions or for help enrolling in any of our support group/programs.    Follow up instructions:    -Return to cancer center March 2025 for follow-up with Dr. Al Pimple -Mammogram due in January 2025 [facility] -She is welcome to return back to the Survivorship Clinic at any time; no additional follow-up needed at this time.  -Consider referral back to survivorship as a long-term survivor for continued surveillance  The patient was provided an opportunity to ask questions and all were answered. The patient agreed with the plan and demonstrated an understanding of the instructions.   Total encounter time:60 minutes*in face-to-face visit time, chart review, lab review, care coordination, order entry, and documentation of the encounter time.    Elizabeth Anes, NP 09/26/22 10:42 AM Medical Oncology  and Hematology [facility] [address] [place], [place] [address] Tel. [phone]    Fax. [phone]  *Total Encounter Time as defined by the Centers for Medicare and Medicaid Services includes, in addition to the face-to-face time of a patient visit (documented in the note above) non-face-to-face time: obtaining and reviewing outside history, ordering and reviewing medications, tests or procedures, care coordination (communications with other health care professionals or caregivers) and documentation in the medical record.

## 2022-10-17 ENCOUNTER — Other Ambulatory Visit (HOSPITAL_BASED_OUTPATIENT_CLINIC_OR_DEPARTMENT_OTHER): Payer: Self-pay

## 2022-10-17 ENCOUNTER — Ambulatory Visit (HOSPITAL_BASED_OUTPATIENT_CLINIC_OR_DEPARTMENT_OTHER)
Admission: RE | Admit: 2022-10-17 | Discharge: 2022-10-17 | Disposition: A | Discharge status: Home / Self Care | Payer: Medicare Other | Source: Ambulatory Visit | Attending: Adult Health | Admitting: Adult Health

## 2022-10-17 DIAGNOSIS — E039 Hypothyroidism, unspecified: Secondary | ICD-10-CM | POA: Diagnosis not present

## 2022-10-17 DIAGNOSIS — M8589 Other specified disorders of bone density and structure, multiple sites: Secondary | ICD-10-CM | POA: Insufficient documentation

## 2022-10-17 DIAGNOSIS — Z853 Personal history of malignant neoplasm of breast: Secondary | ICD-10-CM | POA: Insufficient documentation

## 2022-10-17 DIAGNOSIS — M85852 Other specified disorders of bone density and structure, left thigh: Secondary | ICD-10-CM | POA: Diagnosis not present

## 2022-10-17 DIAGNOSIS — Z780 Asymptomatic menopausal state: Secondary | ICD-10-CM | POA: Insufficient documentation

## 2022-10-17 DIAGNOSIS — Z79811 Long term (current) use of aromatase inhibitors: Secondary | ICD-10-CM | POA: Insufficient documentation

## 2022-10-17 DIAGNOSIS — Z13820 Encounter for screening for osteoporosis: Secondary | ICD-10-CM | POA: Diagnosis not present

## 2022-10-17 MED ORDER — COMIRNATY 30 MCG/0.3ML IM SUSY
0.3000 mL | PREFILLED_SYRINGE | Freq: Once | INTRAMUSCULAR | 0 refills | Status: AC
  Filled 2022-10-17: qty 0.3, 1d supply, fill #0

## 2022-10-19 ENCOUNTER — Telehealth: Payer: Self-pay

## 2022-10-19 NOTE — Telephone Encounter (Signed)
-----   Message from Noreene Filbert sent at 10/17/2022 11:48 AM EDT ----- Bone density shows osteopenia.  Recommend repeat in 2 years, calcium, vitamin d, and weight bearing exercises are also recommended. ----- Message ----- From: Interface, Rad Results In Sent: 10/17/2022  11:35 AM EDT To: Loa Socks, NP

## 2022-10-19 NOTE — Telephone Encounter (Signed)
Attempted to call pt. Per note below received voicemail.

## 2022-11-14 DIAGNOSIS — K080 Exfoliation of teeth due to systemic causes: Secondary | ICD-10-CM | POA: Diagnosis not present

## 2022-12-21 ENCOUNTER — Other Ambulatory Visit: Payer: Self-pay | Admitting: Family Medicine

## 2022-12-31 ENCOUNTER — Telehealth: Payer: Self-pay | Admitting: *Deleted

## 2022-12-31 DIAGNOSIS — Z87898 Personal history of other specified conditions: Secondary | ICD-10-CM

## 2022-12-31 DIAGNOSIS — E785 Hyperlipidemia, unspecified: Secondary | ICD-10-CM

## 2022-12-31 DIAGNOSIS — E038 Other specified hypothyroidism: Secondary | ICD-10-CM

## 2022-12-31 NOTE — Telephone Encounter (Signed)
-----   Message from Alvina Chou sent at 12/31/2022  3:21 PM EST ----- Regarding: Lab orders for Fri, 1.3.25 Patient is scheduled for CPX labs, please order future labs, Thanks , Camelia Eng

## 2023-01-21 DIAGNOSIS — H524 Presbyopia: Secondary | ICD-10-CM | POA: Diagnosis not present

## 2023-01-21 DIAGNOSIS — H25813 Combined forms of age-related cataract, bilateral: Secondary | ICD-10-CM | POA: Diagnosis not present

## 2023-01-21 DIAGNOSIS — R7309 Other abnormal glucose: Secondary | ICD-10-CM | POA: Diagnosis not present

## 2023-01-21 DIAGNOSIS — H04123 Dry eye syndrome of bilateral lacrimal glands: Secondary | ICD-10-CM | POA: Diagnosis not present

## 2023-01-21 DIAGNOSIS — H43811 Vitreous degeneration, right eye: Secondary | ICD-10-CM | POA: Diagnosis not present

## 2023-01-21 LAB — HM DIABETES EYE EXAM

## 2023-01-25 ENCOUNTER — Other Ambulatory Visit (INDEPENDENT_AMBULATORY_CARE_PROVIDER_SITE_OTHER): Payer: Medicare Other

## 2023-01-25 DIAGNOSIS — E038 Other specified hypothyroidism: Secondary | ICD-10-CM | POA: Diagnosis not present

## 2023-01-25 DIAGNOSIS — E785 Hyperlipidemia, unspecified: Secondary | ICD-10-CM

## 2023-01-25 DIAGNOSIS — Z87898 Personal history of other specified conditions: Secondary | ICD-10-CM

## 2023-01-25 LAB — COMPREHENSIVE METABOLIC PANEL
ALT: 17 U/L (ref 0–35)
AST: 22 U/L (ref 0–37)
Albumin: 5.1 g/dL (ref 3.5–5.2)
Alkaline Phosphatase: 91 U/L (ref 39–117)
BUN: 17 mg/dL (ref 6–23)
CO2: 30 meq/L (ref 19–32)
Calcium: 10.7 mg/dL — ABNORMAL HIGH (ref 8.4–10.5)
Chloride: 102 meq/L (ref 96–112)
Creatinine, Ser: 0.93 mg/dL (ref 0.40–1.20)
GFR: 64.24 mL/min (ref >60.00)
Glucose, Bld: 117 mg/dL — ABNORMAL HIGH (ref 70–99)
Potassium: 3.8 meq/L (ref 3.5–5.1)
Sodium: 143 meq/L (ref 135–145)
Total Bilirubin: 1 mg/dL (ref 0.2–1.2)
Total Protein: 7.5 g/dL (ref 6.0–8.3)

## 2023-01-25 LAB — HEMOGLOBIN A1C: Hgb A1c MFr Bld: 6.3 % (ref 4.6–6.5)

## 2023-01-25 LAB — T4, FREE: Free T4: 0.93 ng/dL (ref 0.60–1.60)

## 2023-01-25 LAB — T3, FREE: T3, Free: 3.4 pg/mL (ref 2.3–4.2)

## 2023-01-25 LAB — LIPID PANEL
Cholesterol: 211 mg/dL — ABNORMAL HIGH (ref 0–200)
HDL: 61.8 mg/dL (ref >39.00)
LDL Cholesterol: 119 mg/dL — ABNORMAL HIGH (ref 0–99)
NonHDL: 149.39
Total CHOL/HDL Ratio: 3
Triglycerides: 152 mg/dL — ABNORMAL HIGH (ref 0.0–149.0)
VLDL: 30.4 mg/dL (ref 0.0–40.0)

## 2023-01-25 LAB — TSH: TSH: 8.3 u[IU]/mL — ABNORMAL HIGH (ref 0.35–5.50)

## 2023-01-25 NOTE — Progress Notes (Signed)
 No critical labs need to be addressed urgently. We will discuss labs in detail at upcoming office visit.

## 2023-01-28 ENCOUNTER — Ambulatory Visit: Payer: BC Managed Care – PPO

## 2023-01-28 ENCOUNTER — Telehealth: Payer: Self-pay | Admitting: Family Medicine

## 2023-01-28 NOTE — Telephone Encounter (Signed)
 Copied from CRM [id number]. Topic: General - Other >> Jan 28, 2023  8:25 AM Burnard DEL wrote: Reason for CRM: patient would like a call to reschedule her AWV  with ms brenda toady due to weather  Sending to you because I was not sure if I am able to schedule in person AWVs. Please let me know if I need to reach out to PT to r/s. Thanks!

## 2023-01-30 ENCOUNTER — Ambulatory Visit
Admission: RE | Admit: 2023-01-30 | Discharge: 2023-01-30 | Disposition: A | Discharge status: Home / Self Care | Payer: Medicare Other | Source: Ambulatory Visit | Attending: Adult Health | Admitting: Adult Health

## 2023-01-30 DIAGNOSIS — D0512 Intraductal carcinoma in situ of left breast: Secondary | ICD-10-CM

## 2023-01-30 DIAGNOSIS — Z853 Personal history of malignant neoplasm of breast: Secondary | ICD-10-CM | POA: Diagnosis not present

## 2023-01-30 HISTORY — DX: Personal history of irradiation: Z92.3

## 2023-02-01 ENCOUNTER — Ambulatory Visit (INDEPENDENT_AMBULATORY_CARE_PROVIDER_SITE_OTHER): Payer: Medicare Other | Admitting: Family Medicine

## 2023-02-01 ENCOUNTER — Telehealth: Payer: Self-pay | Admitting: Family Medicine

## 2023-02-01 ENCOUNTER — Encounter: Payer: Self-pay | Admitting: Family Medicine

## 2023-02-01 ENCOUNTER — Other Ambulatory Visit: Payer: Medicare Other

## 2023-02-01 VITALS — BP 130/80 | HR 63 | Temp 98.1°F | Ht 66.5 in | Wt 192.5 lb

## 2023-02-01 DIAGNOSIS — Z0000 Encounter for general adult medical examination without abnormal findings: Secondary | ICD-10-CM | POA: Diagnosis not present

## 2023-02-01 DIAGNOSIS — R7303 Prediabetes: Secondary | ICD-10-CM | POA: Insufficient documentation

## 2023-02-01 DIAGNOSIS — D0512 Intraductal carcinoma in situ of left breast: Secondary | ICD-10-CM

## 2023-02-01 DIAGNOSIS — Z6831 Body mass index (BMI) 31.0-31.9, adult: Secondary | ICD-10-CM

## 2023-02-01 DIAGNOSIS — E6609 Other obesity due to excess calories: Secondary | ICD-10-CM

## 2023-02-01 DIAGNOSIS — E038 Other specified hypothyroidism: Secondary | ICD-10-CM | POA: Diagnosis not present

## 2023-02-01 DIAGNOSIS — I10 Essential (primary) hypertension: Secondary | ICD-10-CM

## 2023-02-01 DIAGNOSIS — E785 Hyperlipidemia, unspecified: Secondary | ICD-10-CM

## 2023-02-01 DIAGNOSIS — M8580 Other specified disorders of bone density and structure, unspecified site: Secondary | ICD-10-CM

## 2023-02-01 DIAGNOSIS — M25562 Pain in left knee: Secondary | ICD-10-CM | POA: Insufficient documentation

## 2023-02-01 DIAGNOSIS — E66811 Obesity, class 1: Secondary | ICD-10-CM

## 2023-02-01 NOTE — Assessment & Plan Note (Signed)
Stable, chronic.  Continue current medication.  Amlodipine 10 mg daily

## 2023-02-01 NOTE — Patient Instructions (Addendum)
 Work on low cholesterol, low carb heart healthy diet, regular walking.  Stop calcium supplement.. change to vit D  2000 international IU  daily.  Start voltaren gel OTC 4 times daily for  2 weeks.. follow up for re-eval in 2 week if not improving.  Can use tylenol  for pain as well.

## 2023-02-01 NOTE — Assessment & Plan Note (Signed)
 ACute.. stop supplement and recheck  in 4-6 weeks.

## 2023-02-01 NOTE — Progress Notes (Signed)
 Patient ID: Elizabeth Fitzpatrick, female    DOB: Jul 28, 1955, 68 y.o.   MRN: [id number]  This visit was conducted in person.  BP 130/80 (BP Location: Right Arm, Patient Position: Sitting, Cuff Size: Large)   Pulse 63   Temp 98.1 F (36.7 C) (Temporal)   Ht 5' 6.5 (1.689 m)   Wt 192 lb 8 oz (87.3 kg)   SpO2 96%   BMI 30.60 kg/m    CC:  Chief Complaint  Patient presents with   Annual Exam    MWV scheduled 02/28/2023   Knee Pain    Left    Subjective:   HPI: Elizabeth Fitzpatrick is a 68 y.o. female presenting on 02/01/2023 for  welcome to medicare wellness The patient presents for annual medicare wellness, complete physical and review of chronic health problems. He/She also has the following acute concerns today:   New lnee pain in left knee after leaning over tub 2 weeks ago.  Using tylenol  for pain.   The patient will see  a LPN or RN for medicare wellness visit. 02/28/2023  Prevention and wellness will be reviewed in detail. Note will be reviewed and important notes copied below.  Newly diagnosed breast cancer 03/2022 DCIS left breast, ER and PR positive  She is status postlumpectomy with negative margins and adjuvant radiation.   She has started anastrozole   x 5 years... tolerating well overall.  Oncology Dr. Loretha   Reviewed labs in detail with patient.  Flowsheet Row Office Visit from 02/01/2023 in [facility]  PHQ-2 Total Score 0       Hypertension:  Stable well-controlled on amlodipine  10 mg p.o. daily  BP Readings from Last 3 Encounters:  02/01/23 130/80  09/26/22 (!) 140/68  06/22/22 (!) 140/70  Using medication without problems or lightheadedness:  Chest pain with exertion: none Edema:none Short of breath: none Average home BPs: Other issues:  Exercise rare  Diet: moderate.  She has been focusing on care giving for her sister.    High cholesterol: LDL NOT at goal less than 899 on simvastatin  20 mg p.o. daily.. has  not bee eating well Lab Results  Component Value Date   CHOL 211 (H) 01/25/2023   HDL 61.80 01/25/2023   LDLCALC 119 (H) 01/25/2023   LDLDIRECT 151.0 11/08/2017   TRIG 152.0 (H) 01/25/2023   CHOLHDL 3 01/25/2023   The 10-year ASCVD risk score (Arnett DK, et al., 2019) is: 8.4%   Values used to calculate the score:     Age: 77 years     Sex: Female     Is Non-Hispanic African American: No     Diabetic: No     Tobacco smoker: No     Systolic Blood Pressure: 130 mmHg     Is BP treated: Yes     HDL Cholesterol: 61.8 mg/dL     Total Cholesterol: 211 mg/dL  Wt Readings from Last 3 Encounters:  02/01/23 192 lb 8 oz (87.3 kg)  09/26/22 193 lb 12.8 oz (87.9 kg)  06/22/22 197 lb 3.2 oz (89.4 kg)  Body mass index is 30.6 kg/m.   Hypothyroidism stable well-controlled on levo 75 mcg daily Lab Results  Component Value Date   TSH 8.30 (H) 01/25/2023          Relevant past medical, surgical, family and social history reviewed and updated as indicated. Interim medical history since our last visit reviewed. Allergies and medications reviewed and updated. Outpatient Medications  Prior to Visit  Medication Sig Dispense Refill   amLODipine  (NORVASC ) 10 MG tablet TAKE 1 TABLET (10 MG TOTAL) BY MOUTH DAILY. 90 tablet 3   anastrozole  (ARIMIDEX ) 1 MG tablet Take 1 tablet (1 mg total) by mouth daily. 90 tablet 3   Calcium-Vitamin D -Vitamin K 500-100-40 MG-UNT-MCG CHEW Chew 2 tablets by mouth daily.     levothyroxine  (SYNTHROID ) 75 MCG tablet TAKE 1 TABLET (75 MCG TOTAL) BY MOUTH DAILY. 90 tablet 0   Multiple Vitamin (MULTIVITAMINS PO) Take 1 tablet by mouth daily.     simvastatin  (ZOCOR ) 20 MG tablet TAKE 1 TABLET (20 MG TOTAL) BY MOUTH AT BEDTIME. 90 tablet 3   COVID-19 mRNA vaccine 2023-2024 (COMIRNATY ) syringe Inject into the muscle. 0.3 mL 0   No facility-administered medications prior to visit.     Per HPI unless specifically indicated in ROS section below Review of Systems   Constitutional:  Negative for fatigue and fever.  HENT:  Negative for congestion.   Eyes:  Negative for pain.  Respiratory:  Negative for cough and shortness of breath.   Cardiovascular:  Negative for chest pain, palpitations and leg swelling.  Gastrointestinal:  Negative for abdominal pain.  Genitourinary:  Negative for dysuria and vaginal bleeding.  Musculoskeletal:  Negative for back pain.  Neurological:  Negative for syncope, light-headedness and headaches.  Psychiatric/Behavioral:  Negative for dysphoric mood.    Objective:  BP 130/80 (BP Location: Right Arm, Patient Position: Sitting, Cuff Size: Large)   Pulse 63   Temp 98.1 F (36.7 C) (Temporal)   Ht 5' 6.5 (1.689 m)   Wt 192 lb 8 oz (87.3 kg)   SpO2 96%   BMI 30.60 kg/m   Wt Readings from Last 3 Encounters:  02/01/23 192 lb 8 oz (87.3 kg)  09/26/22 193 lb 12.8 oz (87.9 kg)  06/22/22 197 lb 3.2 oz (89.4 kg)      Physical Exam Vitals and nursing note reviewed.  Constitutional:      General: She is not in acute distress.    Appearance: Normal appearance. She is well-developed. She is not ill-appearing or toxic-appearing.  HENT:     Head: Normocephalic.     Right Ear: Hearing, tympanic membrane, ear canal and external ear normal.     Left Ear: Hearing, tympanic membrane, ear canal and external ear normal.     Nose: Nose normal.  Eyes:     General: Lids are normal. Lids are everted, no foreign bodies appreciated.     Conjunctiva/sclera: Conjunctivae normal.     Pupils: Pupils are equal, round, and reactive to light.  Neck:     Thyroid : No thyroid  mass or thyromegaly.     Vascular: No carotid bruit.     Trachea: Trachea normal.  Cardiovascular:     Rate and Rhythm: Normal rate and regular rhythm.     Heart sounds: Normal heart sounds, S1 normal and S2 normal. No murmur heard.    No gallop.  Pulmonary:     Effort: Pulmonary effort is normal. No respiratory distress.     Breath sounds: Normal breath sounds. No  wheezing, rhonchi or rales.  Abdominal:     General: Bowel sounds are normal. There is no distension or abdominal bruit.     Palpations: Abdomen is soft. There is no fluid wave or mass.     Tenderness: There is no abdominal tenderness. There is no guarding or rebound.     Hernia: No hernia is present.  Musculoskeletal:  Cervical back: Normal range of motion and neck supple.     Left knee: Crepitus present. No swelling, deformity, erythema or bony tenderness. Decreased range of motion. No tenderness. Normal alignment, normal meniscus and normal patellar mobility.     Instability Tests: Anterior drawer test negative. Posterior drawer test negative. Anterior Lachman test negative. Medial McMurray test negative and lateral McMurray test negative.     Comments: Pain with flexion  Antalgic, slow gait.  Lymphadenopathy:     Cervical: No cervical adenopathy.  Skin:    General: Skin is warm and dry.     Findings: No rash.  Neurological:     Mental Status: She is alert.     Cranial Nerves: No cranial nerve deficit.     Sensory: No sensory deficit.  Psychiatric:        Mood and Affect: Mood is not anxious or depressed.        Speech: Speech normal.        Behavior: Behavior normal. Behavior is cooperative.        Judgment: Judgment normal.       Results for orders placed or performed in visit on 01/25/23  T4, free   Collection Time: 01/25/23  8:46 AM  Result Value Ref Range   Free T4 0.93 0.60 - 1.60 ng/dL  T3, free   Collection Time: 01/25/23  8:46 AM  Result Value Ref Range   T3, Free 3.4 2.3 - 4.2 pg/mL  TSH   Collection Time: 01/25/23  8:46 AM  Result Value Ref Range   TSH 8.30 (H) 0.35 - 5.50 uIU/mL  Lipid panel   Collection Time: 01/25/23  8:46 AM  Result Value Ref Range   Cholesterol 211 (H) 0 - 200 mg/dL   Triglycerides 847.9 (H) 0.0 - 149.0 mg/dL   HDL 38.19 >60.99 mg/dL   VLDL 69.5 0.0 - 59.9 mg/dL   LDL Cholesterol 880 (H) 0 - 99 mg/dL   Total CHOL/HDL Ratio 3     NonHDL 149.39   Hemoglobin A1c   Collection Time: 01/25/23  8:46 AM  Result Value Ref Range   Hgb A1c MFr Bld 6.3 4.6 - 6.5 %  Comprehensive metabolic panel   Collection Time: 01/25/23  8:46 AM  Result Value Ref Range   Sodium 143 135 - 145 mEq/L   Potassium 3.8 3.5 - 5.1 mEq/L   Chloride 102 96 - 112 mEq/L   CO2 30 19 - 32 mEq/L   Glucose, Bld 117 (H) 70 - 99 mg/dL   BUN 17 6 - 23 mg/dL   Creatinine, Ser 9.06 0.40 - 1.20 mg/dL   Total Bilirubin 1.0 0.2 - 1.2 mg/dL   Alkaline Phosphatase 91 39 - 117 U/L   AST 22 0 - 37 U/L   ALT 17 0 - 35 U/L   Total Protein 7.5 6.0 - 8.3 g/dL   Albumin 5.1 3.5 - 5.2 g/dL   GFR 35.75 >39.99 mL/min   Calcium 10.7 (H) 8.4 - 10.5 mg/dL     COVID 19 screen:  No recent travel or known exposure to COVID19 The patient denies respiratory symptoms of COVID 19 at this time. The importance of social distancing was discussed today.   Assessment and Plan The patient's preventative maintenance and recommended screening tests for an annual wellness exam were reviewed in full today. Brought up to date unless services declined.  Counselled on the importance of diet, exercise, and its role in overall health and mortality. The patient's FH and  SH was reviewed, including their home life, tobacco status, and drug and alcohol status.   Vaccines: COVID x 3 and shingles uptodate. Uptodate tdap .flu vaccine had in 09/2021.. due for Prevnar 20 Pap/DVE:  Per GYN last 2019, further indicated. Mammo:   at GYN Bone Density:  normal 2020, repeat  09/2022 borderline osteopenia, repeat in 2 years per onc on estrogen blocker. Colon: 04/25/2020 tubular adenoma Dr. Shila, repeat in 3 years.  Smoking Status:none ETOH/ drug ldz:wnwz/wnwz  Hep C: done   Problem List Items Addressed This Visit     Acute pain of left knee    AScute , likely OA, not clearly associated with anastrazole use. Start voltaren gel OTC 4 times daily for  2 weeks.. follow up for re-eval in 2 week if  not improving... consider X-ray  Can use tylenol  for pain as well.      Calcium blood increased    ACute.. stop supplement and recheck  in 4-6 weeks.      Relevant Orders   Calcium   VITAMIN D  25 Hydroxy (Vit-D Deficiency, Fractures)   Class 1 obesity due to excess calories with serious comorbidity and body mass index (BMI) of 31.0 to 31.9 in adult   Encouraged exercise, weight loss, healthy eating habits. Associated with hypertension or hyperlipidemia.      Ductal carcinoma in situ (DCIS) of left breast   Newly diagnosed breast cancer 03/2022 DCIS left breast, ER and PR positive  She is status postlumpectomy with negative margins and adjuvant radiation.   She has started anastrozole  , plan   Oncology Dr. Loretha      Hyperlipidemia (Chronic)   Worsened chronic.  Continue current medication.  Work on low cholesterol heart healthy diet, regular walking.  Simvastatin  20 mg daily      Hypertension (Chronic)   Stable, chronic.  Continue current medication.  Amlodipine  10 mg daily      Hypothyroidism (Chronic)   Stable, chronic.  Continue current medication.  Levo 75 mcg p.o. daily      Prediabetes    Gradual worsening. Get back on track with low carb,diet      Other Visit Diagnoses       Routine general medical examination at a health care facility    -  Primary     Osteopenia, unspecified location       Relevant Orders   Calcium   VITAMIN D  25 Hydroxy (Vit-D Deficiency, Fractures)         Greig Ring, MD

## 2023-02-01 NOTE — Assessment & Plan Note (Signed)
Stable, chronic.  Continue current medication.  Levo 75 mcg p.o. daily

## 2023-02-01 NOTE — Assessment & Plan Note (Signed)
Encouraged exercise, weight loss, healthy eating habits. Associated with hypertension or hyperlipidemia.

## 2023-02-01 NOTE — Assessment & Plan Note (Signed)
 Gradual worsening. Get back on track with low carb,diet

## 2023-02-01 NOTE — Telephone Encounter (Signed)
 Lvm to confirm with patient

## 2023-02-01 NOTE — Telephone Encounter (Signed)
 Patient has scheduled to do labs 02/06 same day she is coming for AWV  would like to confirm that is alright or if she should do labs on 02/21. States if we need to reschedule we can. Advised patient we would call if the lab appointment needs rescheduled.

## 2023-02-01 NOTE — Assessment & Plan Note (Signed)
 AScute , likely OA, not clearly associated with anastrazole use. Start voltaren gel OTC 4 times daily for  2 weeks.. follow up for re-eval in 2 week if not improving... consider X-ray  Can use tylenol for pain as well.

## 2023-02-01 NOTE — Telephone Encounter (Signed)
 Do you want lab appointment pushed out since she was told today to return in 6 weeks to recheck Ca and Vit D.  Is 02/27/2022 too soon.  Please advise.

## 2023-02-01 NOTE — Assessment & Plan Note (Addendum)
 Worsened chronic.  Continue current medication.  Work on low cholesterol heart healthy diet, regular walking.  Simvastatin 20 mg daily

## 2023-02-01 NOTE — Assessment & Plan Note (Signed)
 Newly diagnosed breast cancer 03/2022 DCIS left breast, ER and PR positive  She is status postlumpectomy with negative margins and adjuvant radiation.   She has started anastrozole , plan   Oncology Dr. Al Pimple

## 2023-02-07 DIAGNOSIS — Z01419 Encounter for gynecological examination (general) (routine) without abnormal findings: Secondary | ICD-10-CM | POA: Diagnosis not present

## 2023-02-22 ENCOUNTER — Other Ambulatory Visit: Payer: Self-pay | Admitting: Family Medicine

## 2023-02-28 ENCOUNTER — Other Ambulatory Visit (INDEPENDENT_AMBULATORY_CARE_PROVIDER_SITE_OTHER): Payer: Medicare Other

## 2023-02-28 ENCOUNTER — Ambulatory Visit: Payer: Medicare Other

## 2023-02-28 VITALS — BP 118/68 | Ht 66.5 in | Wt 195.2 lb

## 2023-02-28 DIAGNOSIS — M8580 Other specified disorders of bone density and structure, unspecified site: Secondary | ICD-10-CM

## 2023-02-28 DIAGNOSIS — Z1211 Encounter for screening for malignant neoplasm of colon: Secondary | ICD-10-CM | POA: Diagnosis not present

## 2023-02-28 DIAGNOSIS — Z0000 Encounter for general adult medical examination without abnormal findings: Secondary | ICD-10-CM

## 2023-02-28 LAB — CALCIUM: Calcium: 10.7 mg/dL — ABNORMAL HIGH (ref 8.4–10.5)

## 2023-02-28 LAB — VITAMIN D 25 HYDROXY (VIT D DEFICIENCY, FRACTURES): VITD: 35.31 ng/mL (ref 30.00–100.00)

## 2023-02-28 NOTE — Progress Notes (Signed)
 Subjective:   Elizabeth Fitzpatrick is a 68 y.o. female who presents for an Initial Medicare Annual Wellness Visit.  Visit Complete: In person  Patient Medicare AWV questionnaire was completed by the patient on (not done); I have confirmed that all information answered by patient is correct and no changes since this date.  Cardiac Risk Factors include: advanced age (>69men, >70 women);dyslipidemia;hypertension;obesity (BMI >30kg/m2)    Objective:    Today's Vitals   02/28/23 1012  Weight: 195 lb 3.2 oz (88.5 kg)  Height: 5' 6.5 (1.689 m)   Body mass index is 31.03 kg/m.     02/28/2023   10:42 AM 04/05/2022    8:25 AM 04/02/2022    8:36 AM  Advanced Directives  Does Patient Have a Medical Advance Directive? No No No  Would patient like information on creating a medical advance directive?  No - Patient declined     Current Medications (verified) Outpatient Encounter Medications as of 02/28/2023  Medication Sig   amLODipine  (NORVASC ) 10 MG tablet TAKE 1 TABLET (10 MG TOTAL) BY MOUTH DAILY.   anastrozole  (ARIMIDEX ) 1 MG tablet Take 1 tablet (1 mg total) by mouth daily.   Cholecalciferol (VITAMIN D ) 50 MCG (2000 UT) tablet Take 2,000 Units by mouth daily.   levothyroxine  (SYNTHROID ) 75 MCG tablet TAKE 1 TABLET (75 MCG TOTAL) BY MOUTH DAILY.   Multiple Vitamin (MULTIVITAMINS PO) Take 1 tablet by mouth daily.   Multiple Vitamins-Minerals (CENTRUM MINIS WOMEN 50+) TABS Take 1,000 tablets by mouth 1 day or 1 dose. Has Vit D 1,000IU   simvastatin  (ZOCOR ) 20 MG tablet TAKE 1 TABLET (20 MG TOTAL) BY MOUTH AT BEDTIME.   Calcium-Vitamin D -Vitamin K 500-100-40 MG-UNT-MCG CHEW Chew 2 tablets by mouth daily. (Patient not taking: Reported on 02/28/2023)   No facility-administered encounter medications on file as of 02/28/2023.    Allergies (verified) Sulfa antibiotics and Lisinopril   History: Past Medical History:  Diagnosis Date   Arthritis    Family history of breast cancer    Family  history of colon cancer    GERD (gastroesophageal reflux disease)    Hyperlipidemia    Hypertension    Hypothyroidism    Personal history of radiation therapy    Prediabetes    Past Surgical History:  Procedure Laterality Date   BREAST BIOPSY Left 02/20/2022   US  LT BREAST BX W LOC DEV 1ST LESION IMG BX SPEC US  GUIDE 02/20/2022 [facility]   BREAST BIOPSY  04/04/2022   MM LT RADIOACTIVE SEED LOC MAMMO GUIDE 04/04/2022 [facility]   BREAST LUMPECTOMY     BREAST LUMPECTOMY WITH RADIOACTIVE SEED LOCALIZATION Left 04/05/2022   Procedure: LEFT BREAST LUMPECTOMY WITH RADIOACTIVE SEED LOCALIZATION;  Surgeon: Vanderbilt Ned, MD;  Location: [facility];  Service: General;  Laterality: Left;   COLONOSCOPY     OVARIAN CYST REMOVAL     pilonidal cyst removal  1980s   TONSILLECTOMY     Family History  Problem Relation Age of Onset   Breast cancer Sister 36   Breast cancer Maternal Aunt        > 50   Colon cancer Maternal Aunt 45   Breast cancer Maternal Aunt        late 60s   Colon cancer Maternal Grandmother    Colon polyps Neg Hx    Esophageal cancer Neg Hx    Rectal cancer Neg Hx    Stomach cancer Neg Hx    Social History  Socioeconomic History   Marital status: Single    Spouse name: Not on file   Number of children: 0   Years of education: Not on file   Highest education level: 12th grade  Occupational History   Occupation: Quarry Manager: [employer]  Tobacco Use   Smoking status: Never   Smokeless tobacco: Never  Substance and Sexual Activity   Alcohol use: No    Alcohol/week: 0.0 standard drinks of alcohol   Drug use: No   Sexual activity: Not on file  Other Topics Concern   Not on file  Social History Narrative   Not on file   Social Drivers of Health   Financial Resource Strain: Low Risk  (02/28/2023)   Overall Financial Resource Strain (CARDIA)    Difficulty of Paying Living Expenses: Not hard at all  Food  Insecurity: No Food Insecurity (02/28/2023)   Hunger Vital Sign    Worried About Running Out of Food in the Last Year: Never true    Ran Out of Food in the Last Year: Never true  Transportation Needs: No Transportation Needs (02/28/2023)   PRAPARE - Administrator, Civil Service (Medical): No    Lack of Transportation (Non-Medical): No  Physical Activity: Sufficiently Active (02/28/2023)   Exercise Vital Sign    Days of Exercise per Week: 5 days    Minutes of Exercise per Session: 30 min  Stress: No Stress Concern Present (02/28/2023)   [employer] of Occupational Health - Occupational Stress Questionnaire    Feeling of Stress : Only a little  Social Connections: Moderately Isolated (02/28/2023)   Social Connection and Isolation Panel [NHANES]    Frequency of Communication with Friends and Family: More than three times a week    Frequency of Social Gatherings with Friends and Family: Once a week    Attends Religious Services: 1 to 4 times per year    Active Member of [employer] or Organizations: No    Attends Engineer, Structural: Never    Marital Status: Never married    Tobacco Counseling Counseling given: Not Answered  Clinical Intake:  Pre-visit preparation completed: Yes  Pain : No/denies pain    BMI - recorded: 31.03 Nutritional Status: BMI > 30  Obese Nutritional Risks: None Diabetes: No  How often do you need to have someone help you when you read instructions, pamphlets, or other written materials from your doctor or pharmacy?: 1 - Never  Interpreter Needed?: No  Comments: lives with sister Information entered by :: B.Armonie Mettler,LPN   Activities of Daily Living    02/28/2023   10:42 AM 04/05/2022    8:30 AM  In your present state of health, do you have any difficulty performing the following activities:  Hearing? 0 0  Vision? 0 0  Difficulty concentrating or making decisions? 0 0  Walking or climbing stairs? 0 0  Dressing or bathing? 0 0   Doing errands, shopping? 0   Preparing Food and eating ? N   Using the Toilet? N   In the past six months, have you accidently leaked urine? N   Do you have problems with loss of bowel control? N   Managing your Medications? N   Managing your Finances? N   Housekeeping or managing your Housekeeping? N     Patient Care Team: Avelina Greig BRAVO, MD as PCP - General (Family Medicine) Jordan, Amy, MD as Consulting Physician (Dermatology) Linnell Devere BRAVO, MD as Consulting  Physician (Obstetrics and Gynecology) Vanderbilt Ned, MD as Consulting Physician (General Surgery) Loretha Ash, MD as Consulting Physician (Hematology and Oncology) Dewey Rush, MD as Consulting Physician (Radiation Oncology) Nandigam, Kavitha V, MD as Consulting Physician (Gastroenterology)  Indicate any recent Medical Services you may have received from other than [facility] providers in the past year (date may be approximate).     Assessment:   This is a routine wellness examination for Elizabeth Fitzpatrick.  Hearing/Vision screen Hearing Screening - Comments:: Pt says her hearing is good Vision Screening - Comments:: Pt says her vision is good ;readers only   Goals Addressed               This Visit's Progress     Patient Stated (pt-stated)        I want to stay healthy and take care of myself       Depression Screen    02/28/2023   10:35 AM 02/01/2023   10:23 AM 01/23/2022   11:27 AM 01/13/2021   10:35 AM 01/12/2020   11:14 AM 01/09/2019   11:10 AM 11/08/2017    8:36 AM  PHQ 2/9 Scores  PHQ - 2 Score 0 0 0 0 0 0 0  PHQ- 9 Score   0        Fall Risk    02/28/2023   10:23 AM  Fall Risk   Falls in the past year? 0  Number falls in past yr: 0  Injury with Fall? 0  Risk for fall due to : No Fall Risks  Follow up Education provided;Falls prevention discussed    MEDICARE RISK AT HOME: Medicare Risk at Home Any stairs in or around the home?: Yes If so, are there any without handrails?: Yes Home free of  loose throw rugs in walkways, pet beds, electrical cords, etc?: Yes Adequate lighting in your home to reduce risk of falls?: Yes Life alert?: No Use of a cane, walker or w/c?: No Grab bars in the bathroom?: Yes Shower chair or bench in shower?: No Elevated toilet seat or a handicapped toilet?: Yes  TIMED UP AND GO:  Was the test performed? Yes  Length of time to ambulate 10 feet: 10 sec Gait steady and fast without use of assistive device    Cognitive Function:        02/28/2023   10:46 AM  6CIT Screen  What Year? 0 points  What month? 0 points  What time? 0 points  Count back from 20 0 points  Months in reverse 0 points  Repeat phrase 0 points  Total Score 0 points    Immunizations Immunization History  Administered Date(s) Administered   Influenza, High Dose Seasonal PF 10/10/2022   Influenza,inj,Quad PF,6+ Mos 09/24/2013, 09/30/2014, 10/03/2015, 10/15/2016, 11/08/2017, 10/16/2018, 10/03/2019, 10/18/2020, 10/17/2021   PFIZER(Purple Top)SARS-COV-2 Vaccination 04/17/2019, 05/12/2019, 11/27/2019, 05/06/2020   PNEUMOCOCCAL CONJUGATE-20 01/23/2022   Pfizer Covid-19 Vaccine Bivalent Booster 46yrs & up 11/18/2020   Pfizer(Comirnaty )Fall Seasonal Vaccine 12 years and older 11/08/2021, 10/17/2022   Td 01/13/2021   Tdap 08/10/2010   Zoster Recombinant(Shingrix ) 07/31/2018, 12/10/2018    TDAP status: Up to date  Flu Vaccine status: Up to date  Pneumococcal vaccine status: Up to date  Covid-19 vaccine status: Completed vaccines  Qualifies for Shingles Vaccine? Yes   Zostavax completed Yes   Shingrix  Completed?: Yes  Screening Tests Health Maintenance  Topic Date Due   COVID-19 Vaccine (8 - 2024-25 season) 12/12/2022   Colonoscopy  04/26/2023   MAMMOGRAM  01/30/2024  Medicare Annual Wellness (AWV)  02/28/2024   DEXA SCAN  10/16/2024   DTaP/Tdap/Td (3 - Td or Tdap) 01/14/2031   Pneumonia Vaccine 84+ Years old  Completed   INFLUENZA VACCINE  Completed   Hepatitis  C Screening  Completed   Zoster Vaccines- Shingrix   Completed   HPV VACCINES  Aged Out    Health Maintenance  Health Maintenance Due  Topic Date Due   COVID-19 Vaccine (8 - 2024-25 season) 12/12/2022   Colonoscopy  04/26/2023    Colorectal cancer screening: Referral to GI placed yes. Pt aware the office will call re: appt.  Mammogram status: Completed 01/30/23. Repeat every year  Bone Density status: Completed 10/17/22. Results reflect: Bone density results: NORMAL. Repeat every 5 years.  Lung Cancer Screening: (Low Dose CT Chest recommended if Age 53-80 years, 20 pack-year currently smoking OR have quit w/in 15years.) does not qualify.   Lung Cancer Screening Referral: no  Additional Screening:  Hepatitis C Screening: does not qualify; Completed 09/30/2014  Vision Screening: Recommended annual ophthalmology exams for early detection of glaucoma and other disorders of the eye. Is the patient up to date with their annual eye exam?  No  Who is the provider or what is the name of the office in which the patient attends annual eye exams? Dr [facility] If pt is not established with a provider, would they like to be referred to a provider to establish care? No .   Dental Screening: Recommended annual dental exams for proper oral hygiene  Diabetic Foot Exam:   Community Resource Referral / Chronic Care Management: CRR required this visit?  No   CCM required this visit?  No    Plan:     I have personally reviewed and noted the following in the patient's chart:   Medical and social history Use of alcohol, tobacco or illicit drugs  Current medications and supplements including opioid prescriptions. Patient is not currently taking opioid prescriptions. Functional ability and status Nutritional status Physical activity Advanced directives List of other physicians Hospitalizations, surgeries, and ER visits in previous 12 months Vitals Screenings to include  cognitive, depression, and falls Referrals and appointments  In addition, I have reviewed and discussed with patient certain preventive protocols, quality metrics, and best practice recommendations. A written personalized care plan for preventive services as well as general preventive health recommendations were provided to patient.    Erminio LITTIE Saris, LPN   07/24/7972   After Visit Summary: (MyChart) Due to this being a telephonic visit, the after visit summary with patients personalized plan was offered to patient via MyChart   Nurse Notes: The patient states they are doing well the patient does indicate she is wondering if she is taking too much vitamin D3. She also relays her left knee is doing better.and has no concerns or questions at this time.

## 2023-02-28 NOTE — Patient Instructions (Signed)
 Elizabeth Fitzpatrick , Thank you for taking time to come for your Medicare Wellness Visit. I appreciate your ongoing commitment to your health goals. Please review the following plan we discussed and let me know if I can assist you in the future.   Referrals/Orders/Follow-Ups/Clinician Recommendations:   Referral for Colonoscopy [facility] [address] [place] [address] [place],  [place]  [address] Main: [phone]   This is a list of the screening recommended for you and due dates:  Health Maintenance  Topic Date Due   COVID-19 Vaccine (8 - 2024-25 season) 12/12/2022   Colon Cancer Screening  04/26/2023   Mammogram  01/30/2024   Medicare Annual Wellness Visit  02/28/2024   DEXA scan (bone density measurement)  10/16/2024   DTaP/Tdap/Td vaccine (3 - Td or Tdap) 01/14/2031   Pneumonia Vaccine  Completed   Flu Shot  Completed   Hepatitis C Screening  Completed   Zoster (Shingles) Vaccine  Completed   HPV Vaccine  Aged Out    Advanced directives: (Declined) Advance directive discussed with you today. Even though you declined this today, please call our office should you change your mind, and we can give you the proper paperwork for you to fill out.  Next Medicare Annual Wellness Visit scheduled for next year: Yes 03/02/2024 @ 10:50am televisit

## 2023-03-01 ENCOUNTER — Encounter: Payer: Self-pay | Admitting: Family Medicine

## 2023-03-04 ENCOUNTER — Other Ambulatory Visit: Payer: Self-pay | Admitting: Family Medicine

## 2023-03-04 NOTE — Progress Notes (Signed)
Dr. Ermalene Searing,  I was going to put future order in for PTH but wasn't sure if you wanted the one with calcium or no calcium.  Please advise.

## 2023-03-08 ENCOUNTER — Other Ambulatory Visit (INDEPENDENT_AMBULATORY_CARE_PROVIDER_SITE_OTHER): Payer: Medicare Other

## 2023-03-09 LAB — PTH, INTACT AND CALCIUM
Calcium: 10.4 mg/dL (ref 8.6–10.4)
PTH: 23 pg/mL (ref 16–77)

## 2023-03-12 ENCOUNTER — Encounter: Payer: Self-pay | Admitting: Family Medicine

## 2023-04-01 ENCOUNTER — Telehealth: Payer: Self-pay

## 2023-04-01 NOTE — Progress Notes (Unsigned)
 BRIEF ONCOLOGIC HISTORY:  Oncology History  Ductal carcinoma in situ (DCIS) of left breast  02/15/2022 Mammogram   She had a screening mammogram and was recalled for a left breast mass.  She then had further investigation, targeted ultrasound which showed a 4 x 4 x 4 mm oval mass left breast 4 o'clock position 3 cm from the nipple felt to correspond with mammographically identified mass.  No left axillary adenopathy   04/05/2022 Pathology Results   Left breast lumpectomy showed focal low-grade DCIS, cribriform type without necrosis, negative for invasive carcinoma, DCIS measured 2.1 mm in greatest linear extent margins free.  Prognostic showed ER 100% positive strong staining PR 100% positive strong staining   05/08/2022 Genetic Testing   Negative genetic testing on the Multicancer + RNA panel.  The report date is May 08, 2022.  The Multi-Cancer + RNA Panel offered by [facility] includes sequencing and/or deletion/duplication analysis of the following 70 genes:  AIP*, ALK, APC*, ATM*, AXIN2*, BAP1*, BARD1*, BLM*, BMPR1A*, BRCA1*, BRCA2*, BRIP1*, CDC73*, CDH1*, CDK4, CDKN1B*, CDKN2A, CHEK2*, CTNNA1*, DICER1*, EPCAM (del/dup only), EGFR, FH*, FLCN*, GREM1 (promoter dup only), HOXB13, KIT, LZTR1, MAX*, MBD4, MEN1*, MET, MITF, MLH1*, MSH2*, MSH3*, MSH6*, MUTYH*, NF1*, NF2*, NTHL1*, PALB2*, PDGFRA, PMS2*, POLD1*, POLE*, POT1*, PRKAR1A*, PTCH1*, PTEN*, RAD51C*, RAD51D*, RB1*, RET, SDHA* (sequencing only), SDHAF2*, SDHB*, SDHC*, SDHD*, SMAD4*, SMARCA4*, SMARCB1*, SMARCE1*, STK11*, SUFU*, TMEM127*, TP53*, TSC1*, TSC2*, VHL*. RNA analysis is performed for * genes.   05/23/2022 - 06/22/2022 Radiation Therapy   Plan Name: Breast_L_BH Site: Breast, Left Technique: 3D Mode: Photon Dose Per Fraction: 2.66 Gy Prescribed Dose (Delivered / Prescribed): 42.56 Gy / 42.56 Gy Prescribed Fxs (Delivered / Prescribed): 16 / 16   Plan Name: Brst_L_Bst_BH Site: Breast, Left Technique: 3D Mode: Photon Dose Per Fraction:  2 Gy Prescribed Dose (Delivered / Prescribed): 8 Gy / 8 Gy Prescribed Fxs (Delivered / Prescribed): 4 / 4   06/2022 -  Anti-estrogen oral therapy   Anastrozole   09/26/2022 Cancer Staging   Staging form: Breast, AJCC 8th Edition - Pathologic: Stage 0 (pTis (DCIS), pN0, cM0, ER+, PR+) - Signed by Loa Socks, NP on 09/26/2022 Stage prefix: Initial diagnosis     INTERVAL HISTORY:   Overall, Ms. Burch reports feeling quite well.  She is taking anastrozole daily.  She tolerates this moderately well.     REVIEW OF SYSTEMS:  Review of Systems  Constitutional:  Negative for appetite change, chills, fatigue, fever and unexpected weight change.  HENT:   Negative for hearing loss, lump/mass and trouble swallowing.   Eyes:  Negative for eye problems and icterus.  Respiratory:  Negative for chest tightness, cough and shortness of breath.   Cardiovascular:  Negative for chest pain, leg swelling and palpitations.  Gastrointestinal:  Negative for abdominal distention, abdominal pain, constipation, diarrhea, nausea and vomiting.  Endocrine: Positive for hot flashes.  Genitourinary:  Negative for difficulty urinating.   Musculoskeletal:  Negative for arthralgias.  Skin:  Negative for itching and rash.  Neurological:  Negative for dizziness, extremity weakness, headaches and numbness.  Hematological:  Negative for adenopathy. Does not bruise/bleed easily.  Psychiatric/Behavioral:  Negative for depression. The patient is not nervous/anxious.    Breast: Denies any new nodularity, masses, tenderness, nipple changes, or nipple discharge.       PAST MEDICAL/SURGICAL HISTORY:  Past Medical History:  Diagnosis Date   Arthritis    Family history of breast cancer    Family history of colon cancer    GERD (gastroesophageal reflux  disease)    Hyperlipidemia    Hypertension    Hypothyroidism    Personal history of radiation therapy    Prediabetes    Past Surgical History:  Procedure  Laterality Date   BREAST BIOPSY Left 02/20/2022   [place] LT BREAST BX W LOC DEV 1ST LESION IMG BX SPEC US GUIDE 02/20/2022 [facility]   BREAST BIOPSY  04/04/2022   MM LT RADIOACTIVE SEED LOC MAMMO GUIDE 04/04/2022 [facility]   BREAST LUMPECTOMY     BREAST LUMPECTOMY WITH RADIOACTIVE SEED LOCALIZATION Left 04/05/2022   Procedure: LEFT BREAST LUMPECTOMY WITH RADIOACTIVE SEED LOCALIZATION;  Surgeon: Harriette Bouillon, MD;  Location: [facility];  Service: General;  Laterality: Left;   COLONOSCOPY     OVARIAN CYST REMOVAL     pilonidal cyst removal  1980s   TONSILLECTOMY       ALLERGIES:  Allergies  Allergen Reactions   Sulfa Antibiotics Hives and Swelling   Lisinopril     Bradycardia      CURRENT MEDICATIONS:  Outpatient Encounter Medications as of 04/02/2023  Medication Sig   amLODipine (NORVASC) 10 MG tablet TAKE 1 TABLET (10 MG TOTAL) BY MOUTH DAILY.   anastrozole (ARIMIDEX) 1 MG tablet Take 1 tablet (1 mg total) by mouth daily.   Calcium-Vitamin D-Vitamin K 500-100-40 MG-UNT-MCG CHEW Chew 2 tablets by mouth daily. (Patient not taking: Reported on 02/28/2023)   Cholecalciferol (VITAMIN D) 50 MCG (2000 UT) tablet Take 2,000 Units by mouth daily.   levothyroxine (SYNTHROID) 75 MCG tablet TAKE 1 TABLET (75 MCG TOTAL) BY MOUTH DAILY.   Multiple Vitamin (MULTIVITAMINS PO) Take 1 tablet by mouth daily.   Multiple Vitamins-Minerals (CENTRUM MINIS WOMEN 50+) TABS Take 1,000 tablets by mouth 1 day or 1 dose. Has Vit D 1,000IU   simvastatin (ZOCOR) 20 MG tablet TAKE 1 TABLET (20 MG TOTAL) BY MOUTH AT BEDTIME.   No facility-administered encounter medications on file as of 04/02/2023.     ONCOLOGIC FAMILY HISTORY:  Family History  Problem Relation Age of Onset   Breast cancer Sister 35   Breast cancer Maternal Aunt        > 50   Colon cancer Maternal Aunt 45   Breast cancer Maternal Aunt        late 60s   Colon cancer Maternal Grandmother    Colon polyps Neg  Hx    Esophageal cancer Neg Hx    Rectal cancer Neg Hx    Stomach cancer Neg Hx      SOCIAL HISTORY:  Social History   Socioeconomic History   Marital status: Single    Spouse name: Not on file   Number of children: 0   Years of education: Not on file   Highest education level: 12th grade  Occupational History   Occupation: Quarry manager: [employer]  Tobacco Use   Smoking status: Never   Smokeless tobacco: Never  Substance and Sexual Activity   Alcohol use: No    Alcohol/week: 0.0 standard drinks of alcohol   Drug use: No   Sexual activity: Not on file  Other Topics Concern   Not on file  Social History Narrative   Not on file   Social Drivers of Health   Financial Resource Strain: Low Risk  (02/28/2023)   Overall Financial Resource Strain (CARDIA)    Difficulty of Paying Living Expenses: Not hard at all  Food Insecurity: No Food Insecurity (02/28/2023)   Hunger Vital Sign  Worried About Programme researcher, broadcasting/film/video in the Last Year: Never true    Ran Out of Food in the Last Year: Never true  Transportation Needs: No Transportation Needs (02/28/2023)   PRAPARE - Administrator, Civil Service (Medical): No    Lack of Transportation (Non-Medical): No  Physical Activity: Sufficiently Active (02/28/2023)   Exercise Vital Sign    Days of Exercise per Week: 5 days    Minutes of Exercise per Session: 30 min  Stress: No Stress Concern Present (02/28/2023)   [employer] of Occupational Health - Occupational Stress Questionnaire    Feeling of Stress : Only a little  Social Connections: Moderately Isolated (02/28/2023)   Social Connection and Isolation Panel [NHANES]    Frequency of Communication with Friends and Family: More than three times a week    Frequency of Social Gatherings with Friends and Family: Once a week    Attends Religious Services: 1 to 4 times per year    Active Member of [employer] or Organizations: No    Attends Banker  Meetings: Never    Marital Status: Never married  Intimate Partner Violence: Not At Risk (02/28/2023)   Humiliation, Afraid, Rape, and Kick questionnaire    Fear of Current or Ex-Partner: No    Emotionally Abused: No    Physically Abused: No    Sexually Abused: No     OBSERVATIONS/OBJECTIVE:  There were no vitals taken for this visit. GENERAL: Patient is a well appearing female in no acute distress HEENT:  Sclerae anicteric.  Oropharynx clear and moist. No ulcerations or evidence of oropharyngeal candidiasis. Neck is supple.  NODES:  No cervical, supraclavicular, or axillary lymphadenopathy palpated.  BREAST EXAM: Left breast status postlumpectomy and radiation no sign of local recurrence right breast is benign. LUNGS:  Clear to auscultation bilaterally.  No wheezes or rhonchi. HEART:  Regular rate and rhythm. No murmur appreciated. ABDOMEN:  Soft, nontender.  Positive, normoactive bowel sounds. No organomegaly palpated. MSK:  No focal spinal tenderness to palpation. Full range of motion bilaterally in the upper extremities. EXTREMITIES:  No peripheral edema.   SKIN:  Clear with no obvious rashes or skin changes. No nail dyscrasia. NEURO:  Nonfocal. Well oriented.  Appropriate affect.   LABORATORY DATA:  None for this visit.  DIAGNOSTIC IMAGING:  None for this visit.      ASSESSMENT AND PLAN:  Ms.. Demarco is a pleasant 68 y.o. female with Stage 0 left breast DCIS, ER+/PR+, diagnosed in 01/2022, treated with lumpectomy, adjuvant radiation therapy, and anti-estrogen therapy with Anastrozole beginning in 09/2022.     *Total Encounter Time as defined by the Centers for Medicare and Medicaid Services includes, in addition to the face-to-face time of a patient visit (documented in the note above) non-face-to-face time: obtaining and reviewing outside history, ordering and reviewing medications, tests or procedures, care coordination (communications with other health care professionals or  caregivers) and documentation in the medical record.

## 2023-04-01 NOTE — Telephone Encounter (Signed)
 Left message on voicemail for patient about appointment on 04/02/23

## 2023-04-02 ENCOUNTER — Ambulatory Visit: Payer: Medicare Other | Admitting: Hematology and Oncology

## 2023-04-02 ENCOUNTER — Inpatient Hospital Stay: Payer: Medicare Other | Attending: Hematology and Oncology | Admitting: Hematology and Oncology

## 2023-04-02 ENCOUNTER — Encounter: Payer: Self-pay | Admitting: Hematology and Oncology

## 2023-04-02 VITALS — BP 137/74 | HR 67 | Temp 98.6°F | Resp 16 | Wt 198.3 lb

## 2023-04-02 DIAGNOSIS — Z803 Family history of malignant neoplasm of breast: Secondary | ICD-10-CM | POA: Diagnosis not present

## 2023-04-02 DIAGNOSIS — Z1721 Progesterone receptor positive status: Secondary | ICD-10-CM | POA: Insufficient documentation

## 2023-04-02 DIAGNOSIS — Z923 Personal history of irradiation: Secondary | ICD-10-CM | POA: Insufficient documentation

## 2023-04-02 DIAGNOSIS — M1909 Primary osteoarthritis, other specified site: Secondary | ICD-10-CM | POA: Insufficient documentation

## 2023-04-02 DIAGNOSIS — M179 Osteoarthritis of knee, unspecified: Secondary | ICD-10-CM | POA: Diagnosis not present

## 2023-04-02 DIAGNOSIS — Z79811 Long term (current) use of aromatase inhibitors: Secondary | ICD-10-CM | POA: Insufficient documentation

## 2023-04-02 DIAGNOSIS — M8580 Other specified disorders of bone density and structure, unspecified site: Secondary | ICD-10-CM | POA: Diagnosis not present

## 2023-04-02 DIAGNOSIS — D0512 Intraductal carcinoma in situ of left breast: Secondary | ICD-10-CM | POA: Diagnosis not present

## 2023-04-02 DIAGNOSIS — Z170 Estrogen receptor positive status [ER+]: Secondary | ICD-10-CM | POA: Insufficient documentation

## 2023-04-02 DIAGNOSIS — Z800 Family history of malignant neoplasm of digestive organs: Secondary | ICD-10-CM | POA: Insufficient documentation

## 2023-04-02 NOTE — Assessment & Plan Note (Signed)
 Newly diagnosed breast cancer 03/2022 DCIS left breast, ER and PR positive  She is status postlumpectomy with negative margins and adjuvant radiation.  She is now on adjuvant anastrozole.

## 2023-04-04 ENCOUNTER — Other Ambulatory Visit: Payer: Self-pay | Admitting: Family Medicine

## 2023-04-15 ENCOUNTER — Telehealth: Payer: Self-pay | Admitting: Gastroenterology

## 2023-04-15 NOTE — Telephone Encounter (Signed)
 PT has a recall for April 2025 and wants to know before she schedules if she would be able to have her internal and external hemorrhoids fixed in the process. Please avise.

## 2023-04-15 NOTE — Telephone Encounter (Signed)
 Pt called to get scheduled for recall Colon. Pt questioned of her hemorrhoids could be banded during the procedure. Pt was made aware that this is done in the office and not during the procedure. Pt was scheduled for a previsit on 05/07/2023 at 10:00 AM. Pt made aware. Location provided.  Pt was scheduled for a colonoscopy with Dr. Lavon Paganini on 06/05/2023 at 9:30 AM. Pt made aware.  Pt verbalized understanding with all questions answered.

## 2023-04-18 ENCOUNTER — Other Ambulatory Visit: Payer: Self-pay | Admitting: Family Medicine

## 2023-05-07 ENCOUNTER — Ambulatory Visit (AMBULATORY_SURGERY_CENTER): Admitting: *Deleted

## 2023-05-07 VITALS — Ht 66.5 in | Wt 192.0 lb

## 2023-05-07 DIAGNOSIS — Z860100 Personal history of colon polyps, unspecified: Secondary | ICD-10-CM

## 2023-05-07 DIAGNOSIS — Z800 Family history of malignant neoplasm of digestive organs: Secondary | ICD-10-CM

## 2023-05-07 MED ORDER — NA SULFATE-K SULFATE-MG SULF 17.5-3.13-1.6 GM/177ML PO SOLN: 1.0000 | Freq: Once | ORAL | 0 refills | Status: AC

## 2023-05-07 NOTE — Progress Notes (Signed)
.  Pt's name and DOB verified at the beginning of the pre-visit wit 2 identifiers  Pt denies any difficulty with ambulating,sitting, laying down or rolling side to side  Pt has no issues with ambulation   Pt has no issues moving head neck or swallowing  No egg or soy allergy known to patient   Pt has hx of PONV many years ago per pt  Pt denies having issues being intubated  No FH of Malignant Hyperthermia  Pt is not on diet pills or shots  Pt is not on home 02   Pt is not on blood thinners   Pt denies issues with constipation   Pt is not on dialysis  Pt denise any abnormal heart rhythms   Pt denies any upcoming cardiac testing  Patient's chart reviewed by Rogena Class CNRA prior to pre-visit and patient appropriate for the [facility].  Pre-visit completed and red dot placed by patient's name on their procedure day (on provider's schedule).    Visit by phone  Pt states weight is 192 lb  IInstructions reviewed. Pt given , [facility] main # and MD on call # prior to instructions.  Pt states understanding of instructions. Instructed to review again prior to procedure. Pt states they will.  Pt hx of Breast CA early L side

## 2023-05-27 DIAGNOSIS — K080 Exfoliation of teeth due to systemic causes: Secondary | ICD-10-CM | POA: Diagnosis not present

## 2023-05-29 ENCOUNTER — Encounter (HOSPITAL_COMMUNITY): Payer: Self-pay

## 2023-06-05 ENCOUNTER — Ambulatory Visit: Admitting: Gastroenterology

## 2023-06-05 ENCOUNTER — Encounter: Payer: Self-pay | Admitting: Gastroenterology

## 2023-06-05 VITALS — BP 125/64 | HR 60 | Temp 98.2°F | Resp 27 | Ht 66.5 in | Wt 192.0 lb

## 2023-06-05 DIAGNOSIS — K5730 Diverticulosis of large intestine without perforation or abscess without bleeding: Secondary | ICD-10-CM | POA: Diagnosis not present

## 2023-06-05 DIAGNOSIS — K644 Residual hemorrhoidal skin tags: Secondary | ICD-10-CM

## 2023-06-05 DIAGNOSIS — Z800 Family history of malignant neoplasm of digestive organs: Secondary | ICD-10-CM

## 2023-06-05 DIAGNOSIS — D125 Benign neoplasm of sigmoid colon: Secondary | ICD-10-CM | POA: Diagnosis not present

## 2023-06-05 DIAGNOSIS — Z1211 Encounter for screening for malignant neoplasm of colon: Secondary | ICD-10-CM

## 2023-06-05 DIAGNOSIS — K648 Other hemorrhoids: Secondary | ICD-10-CM

## 2023-06-05 DIAGNOSIS — Z860100 Personal history of colon polyps, unspecified: Secondary | ICD-10-CM

## 2023-06-05 MED ORDER — SODIUM CHLORIDE 0.9 % IV SOLN
500.0000 mL | Freq: Once | INTRAVENOUS | Status: DC
Start: 2023-06-05 — End: 2023-06-05

## 2023-06-05 NOTE — Op Note (Signed)
 [facility] Patient Name: Elizabeth Fitzpatrick Procedure Date: 06/05/2023 10:18 AM MRN: [id number] Endoscopist: Sergio Dandy , MD, [id number] Age: 68 Referring MD:  Date of Birth: 1955/10/08 Gender: Female Account #: [id number] Procedure:                Colonoscopy Indications:              High risk colon cancer surveillance: Personal                            history of adenoma (10 mm or greater in size) Medicines:                Monitored Anesthesia Care Procedure:                Pre-Anesthesia Assessment:                           - Prior to the procedure, a History and Physical                            was performed, and patient medications and                            allergies were reviewed. The patient's tolerance of                            previous anesthesia was also reviewed. The risks                            and benefits of the procedure and the sedation                            options and risks were discussed with the patient.                            All questions were answered, and informed consent                            was obtained. Prior Anticoagulants: The patient has                            taken no anticoagulant or antiplatelet agents. ASA                            Grade Assessment: II - A patient with mild systemic                            disease. After reviewing the risks and benefits,                            the patient was deemed in satisfactory condition to                            undergo the procedure.  After obtaining informed consent, the colonoscope                            was passed under direct vision. Throughout the                            procedure, the patient's blood pressure, pulse, and                            oxygen saturations were monitored continuously. The                            [id number]-HQ190L Colonoscope [id number] was introduced                            through the  anus and advanced to the the cecum,                            identified by appendiceal orifice and ileocecal                            valve. The colonoscopy was performed without                            difficulty. The patient tolerated the procedure                            well. The quality of the bowel preparation was                            adequate. The ileocecal valve, appendiceal orifice,                            and rectum were photographed. Scope In: 10:22:00 AM Scope Out: 10:39:56 AM Scope Withdrawal Time: 0 hours 10 minutes 43 seconds  Total Procedure Duration: 0 hours 17 minutes 56 seconds  Findings:                 The perianal and digital rectal examinations were                            normal.                           A 5 mm polyp was found in the sigmoid colon. The                            polyp was sessile. The polyp was removed with a                            cold snare. Resection and retrieval were complete.                           Scattered large-mouthed, medium-mouthed and  small-mouthed diverticula were found in the sigmoid                            colon, descending colon, transverse colon and                            ascending colon.                           Non-bleeding external and internal hemorrhoids were                            found during retroflexion. The hemorrhoids were                            small. Complications:            No immediate complications. Estimated Blood Loss:     Estimated blood loss was minimal. Impression:               - One 5 mm polyp in the sigmoid colon, removed with                            a cold snare. Resected and retrieved.                           - Diverticulosis in the sigmoid colon, in the                            descending colon, in the transverse colon and in                            the ascending colon.                           - Non-bleeding external  and internal hemorrhoids. Recommendation:           - Patient has a contact number available for                            emergencies. The signs and symptoms of potential                            delayed complications were discussed with the                            patient. Return to normal activities tomorrow.                            Written discharge instructions were provided to the                            patient.                           - Resume previous diet.                           -  Continue present medications.                           - Await pathology results.                           - Repeat colonoscopy in 5 years for surveillance                            based on pathology results. Elizabeth Theall V. Gypsy Kellogg, MD 06/05/2023 10:47:09 AM This report has been signed electronically.

## 2023-06-05 NOTE — Progress Notes (Signed)
 [facility] History and Physical   Primary Care Physician:  Judithann Novas, MD   Reason for Procedure:  History of adenomatous colon polyps  Plan:    Surveillance colonoscopy with possible interventions as needed     HPI: Elizabeth Fitzpatrick is a very pleasant 68 y.o. female here for surveillance colonoscopy. Denies any nausea, vomiting, abdominal pain, melena or bright red blood per rectum  The risks and benefits as well as alternatives of endoscopic procedure(s) have been discussed and reviewed. All questions answered. The patient agrees to proceed.    Past Medical History:  Diagnosis Date   Arthritis    Cancer (HCC)    Cataract    Family history of breast cancer    Family history of colon cancer    GERD (gastroesophageal reflux disease)    Hyperlipidemia    Hypertension    Hypothyroidism    Osteopenia    Personal history of radiation therapy    Prediabetes     Past Surgical History:  Procedure Laterality Date   BREAST BIOPSY Left 02/20/2022   US  LT BREAST BX W LOC DEV 1ST LESION IMG BX SPEC US  GUIDE 02/20/2022 [facility]   BREAST BIOPSY  04/04/2022   MM LT RADIOACTIVE SEED LOC MAMMO GUIDE 04/04/2022 [facility]   BREAST LUMPECTOMY     BREAST LUMPECTOMY WITH RADIOACTIVE SEED LOCALIZATION Left 04/05/2022   Procedure: LEFT BREAST LUMPECTOMY WITH RADIOACTIVE SEED LOCALIZATION;  Surgeon: Sim Dryer, MD;  Location: [facility];  Service: General;  Laterality: Left;   COLONOSCOPY     OVARIAN CYST REMOVAL     pilonidal cyst removal  1980s   TONSILLECTOMY      Prior to Admission medications   Medication Sig Start Date End Date Taking? Authorizing Provider  amLODipine  (NORVASC ) 10 MG tablet TAKE 1 TABLET (10 MG TOTAL) BY MOUTH DAILY. 02/22/23   Bedsole, Amy E, MD  anastrozole  (ARIMIDEX ) 1 MG tablet Take 1 tablet (1 mg total) by mouth daily. 06/22/22   Iruku, Praveena, MD  Cholecalciferol (VITAMIN D ) 50 MCG (2000 UT) tablet Take  2,000 Units by mouth daily.    [provider]  Cholecalciferol (VITAMIN D3) 50 MCG (2000 UT) capsule Take 2,000 Units by mouth daily.    [provider]  levothyroxine  (SYNTHROID ) 75 MCG tablet TAKE 1 TABLET (75 MCG TOTAL) BY MOUTH DAILY. 04/04/23   Bedsole, Amy E, MD  Multiple Vitamins-Minerals (CENTRUM MINIS WOMEN 50+) TABS Take 1,000 tablets by mouth 1 day or 1 dose. Has Vit D 1,000IU    [provider]  simvastatin  (ZOCOR ) 20 MG tablet TAKE 1 TABLET (20 MG TOTAL) BY MOUTH AT BEDTIME. 04/18/23   Bedsole, Amy E, MD    Current Outpatient Medications  Medication Sig Dispense Refill   amLODipine  (NORVASC ) 10 MG tablet TAKE 1 TABLET (10 MG TOTAL) BY MOUTH DAILY. 90 tablet 3   anastrozole  (ARIMIDEX ) 1 MG tablet Take 1 tablet (1 mg total) by mouth daily. 90 tablet 3   Cholecalciferol (VITAMIN D ) 50 MCG (2000 UT) tablet Take 2,000 Units by mouth daily.     Cholecalciferol (VITAMIN D3) 50 MCG (2000 UT) capsule Take 2,000 Units by mouth daily.     levothyroxine  (SYNTHROID ) 75 MCG tablet TAKE 1 TABLET (75 MCG TOTAL) BY MOUTH DAILY. 90 tablet 3   Multiple Vitamins-Minerals (CENTRUM MINIS WOMEN 50+) TABS Take 1,000 tablets by mouth 1 day or 1 dose. Has Vit D 1,000IU     simvastatin  (ZOCOR ) 20 MG  tablet TAKE 1 TABLET (20 MG TOTAL) BY MOUTH AT BEDTIME. 90 tablet 3   Current Facility-Administered Medications  Medication Dose Route Frequency Provider Last Rate Last Admin   0.9 %  sodium chloride  infusion  500 mL Intravenous Once Josphine Laffey V, MD        Allergies as of 06/05/2023 - Review Complete 06/05/2023  Allergen Reaction Noted   Lisinopril Other (See Comments) 08/15/2011   Sulfa antibiotics Hives and Swelling 04/26/2010    Family History  Problem Relation Age of Onset   Breast cancer Sister 27   Breast cancer Maternal Aunt        > 50   Colon cancer Maternal Aunt 45   Breast cancer Maternal Aunt        late 60s   Colon cancer Maternal Grandmother    Colon  polyps Neg Hx    Esophageal cancer Neg Hx    Rectal cancer Neg Hx    Stomach cancer Neg Hx     Social History   Socioeconomic History   Marital status: Single    Spouse name: Not on file   Number of children: 0   Years of education: Not on file   Highest education level: 12th grade  Occupational History   Occupation: Quarry manager: [employer]  Tobacco Use   Smoking status: Never   Smokeless tobacco: Never  Substance and Sexual Activity   Alcohol use: No    Alcohol/week: 0.0 standard drinks of alcohol   Drug use: No   Sexual activity: Not Currently  Other Topics Concern   Not on file  Social History Narrative   Not on file   Social Drivers of Health   Financial Resource Strain: Low Risk  (02/28/2023)   Overall Financial Resource Strain (CARDIA)    Difficulty of Paying Living Expenses: Not hard at all  Food Insecurity: No Food Insecurity (02/28/2023)   Hunger Vital Sign    Worried About Running Out of Food in the Last Year: Never true    Ran Out of Food in the Last Year: Never true  Transportation Needs: No Transportation Needs (02/28/2023)   PRAPARE - Administrator, Civil Service (Medical): No    Lack of Transportation (Non-Medical): No  Physical Activity: Sufficiently Active (02/28/2023)   Exercise Vital Sign    Days of Exercise per Week: 5 days    Minutes of Exercise per Session: 30 min  Stress: No Stress Concern Present (02/28/2023)   [employer] of Occupational Health - Occupational Stress Questionnaire    Feeling of Stress : Only a little  Social Connections: Moderately Isolated (02/28/2023)   Social Connection and Isolation Panel [NHANES]    Frequency of Communication with Friends and Family: More than three times a week    Frequency of Social Gatherings with Friends and Family: Once a week    Attends Religious Services: 1 to 4 times per year    Active Member of [employer] or Organizations: No    Attends Banker Meetings:  Never    Marital Status: Never married  Intimate Partner Violence: Not At Risk (02/28/2023)   Humiliation, Afraid, Rape, and Kick questionnaire    Fear of Current or Ex-Partner: No    Emotionally Abused: No    Physically Abused: No    Sexually Abused: No    Review of Systems:  All other review of systems negative except as mentioned in the HPI.  Physical Exam: Vital signs  in last 24 hours: BP (!) 157/85   Pulse 71   Temp 98.2 F (36.8 C) (Skin)   Ht 5' 6.5" (1.689 m)   Wt 192 lb (87.1 kg)   SpO2 95%   BMI 30.53 kg/m  General:   Alert, NAD Lungs:  Clear .   Heart:  Regular rate and rhythm Abdomen:  Soft, nontender and nondistended. Neuro/Psych:  Alert and cooperative. Normal mood and affect. A and O x 3  Reviewed labs, radiology imaging, old records and pertinent past GI work up  Patient is appropriate for planned procedure(s) and anesthesia in an ambulatory setting   K. Veena Sophie Quiles , MD [phone]

## 2023-06-05 NOTE — Progress Notes (Signed)
 Pt's states no medical or surgical changes since previsit or office visit.

## 2023-06-05 NOTE — Progress Notes (Signed)
 Sedate, gd SR, tolerated procedure well, VSS, report to RN

## 2023-06-05 NOTE — Progress Notes (Signed)
 Called to room to assist during endoscopic procedure.  Patient ID and intended procedure confirmed with present staff. Received instructions for my participation in the procedure from the performing physician.

## 2023-06-05 NOTE — Patient Instructions (Addendum)
-   1 polyp removed and sent to pathology - Hemorrhoids and diverticulosis - Resume previous diet. - Continue present medications. - Await pathology results. - Repeat colonoscopy in 5 years for surveillance    based on pathology results.  YOU HAD AN ENDOSCOPIC PROCEDURE TODAY AT THE [facility]:   Refer to the procedure report that was given to you for any specific questions about what was found during the examination.  If the procedure report does not answer your questions, please call your gastroenterologist to clarify.  If you requested that your care partner not be given the details of your procedure findings, then the procedure report has been included in a sealed envelope for you to review at your convenience later.  YOU SHOULD EXPECT: Some feelings of bloating in the abdomen. Passage of more gas than usual.  Walking can help get rid of the air that was put into your GI tract during the procedure and reduce the bloating. If you had a lower endoscopy (such as a colonoscopy or flexible sigmoidoscopy) you may notice spotting of blood in your stool or on the toilet paper. If you underwent a bowel prep for your procedure, you may not have a normal bowel movement for a few days.  Please Note:  You might notice some irritation and congestion in your nose or some drainage.  This is from the oxygen used during your procedure.  There is no need for concern and it should clear up in a day or so.  SYMPTOMS TO REPORT IMMEDIATELY:  Following lower endoscopy (colonoscopy or flexible sigmoidoscopy):  Excessive amounts of blood in the stool  Significant tenderness or worsening of abdominal pains  Swelling of the abdomen that is new, acute  Fever of 100F or higher   For urgent or emergent issues, a gastroenterologist can be reached at any hour by calling [phone]. Do not use MyChart messaging for urgent concerns.    DIET:  We do recommend a small meal at  first, but then you may proceed to your regular diet.  Drink plenty of fluids but you should avoid alcoholic beverages for 24 hours.  ACTIVITY:  You should plan to take it easy for the rest of today and you should NOT DRIVE or use heavy machinery until tomorrow (because of the sedation medicines used during the test).    FOLLOW UP: Our staff will call the number listed on your records the next business day following your procedure.  We will call around 7:15- 8:00 am to check on you and address any questions or concerns that you may have regarding the information given to you following your procedure. If we do not reach you, we will leave a message.     If any biopsies were taken you will be contacted by phone or by letter within the next 1-3 weeks.  Please call us  at [phone] if you have not heard about the biopsies in 3 weeks.    SIGNATURES/CONFIDENTIALITY: You and/or your care partner have signed paperwork which will be entered into your electronic medical record.  These signatures attest to the fact that that the information above on your After Visit Summary has been reviewed and is understood.  Full responsibility of the confidentiality of this discharge information lies with you and/or your care-partner.

## 2023-06-11 LAB — SURGICAL PATHOLOGY

## 2023-06-25 ENCOUNTER — Other Ambulatory Visit: Payer: Self-pay | Admitting: Hematology and Oncology

## 2023-07-10 ENCOUNTER — Ambulatory Visit: Payer: Self-pay | Admitting: Gastroenterology

## 2023-09-06 DIAGNOSIS — D0512 Intraductal carcinoma in situ of left breast: Secondary | ICD-10-CM | POA: Diagnosis not present

## 2023-10-11 ENCOUNTER — Other Ambulatory Visit: Payer: Self-pay | Admitting: *Deleted

## 2023-10-17 ENCOUNTER — Other Ambulatory Visit (HOSPITAL_BASED_OUTPATIENT_CLINIC_OR_DEPARTMENT_OTHER): Payer: Self-pay

## 2023-10-17 MED ORDER — COMIRNATY 30 MCG/0.3ML IM SUSY
0.3000 mL | PREFILLED_SYRINGE | Freq: Once | INTRAMUSCULAR | 0 refills | Status: AC
  Filled 2023-10-17: qty 0.3, 1d supply, fill #0

## 2023-12-16 DIAGNOSIS — K080 Exfoliation of teeth due to systemic causes: Secondary | ICD-10-CM | POA: Diagnosis not present

## 2024-01-20 ENCOUNTER — Telehealth: Payer: Self-pay | Admitting: *Deleted

## 2024-01-20 DIAGNOSIS — E038 Other specified hypothyroidism: Secondary | ICD-10-CM

## 2024-01-20 DIAGNOSIS — Z87898 Personal history of other specified conditions: Secondary | ICD-10-CM

## 2024-01-20 DIAGNOSIS — M8580 Other specified disorders of bone density and structure, unspecified site: Secondary | ICD-10-CM

## 2024-01-20 DIAGNOSIS — E785 Hyperlipidemia, unspecified: Secondary | ICD-10-CM

## 2024-01-20 NOTE — Telephone Encounter (Signed)
-----   Message from Harlene Du sent at 01/17/2024  2:10 PM EST ----- Regarding: Labs Wed 01/29/24 Hello,  Patient is coming in for CPE labs. Can we get orders please.   Thanks

## 2024-01-29 ENCOUNTER — Other Ambulatory Visit (INDEPENDENT_AMBULATORY_CARE_PROVIDER_SITE_OTHER)

## 2024-01-29 DIAGNOSIS — M8580 Other specified disorders of bone density and structure, unspecified site: Secondary | ICD-10-CM

## 2024-01-29 DIAGNOSIS — E785 Hyperlipidemia, unspecified: Secondary | ICD-10-CM | POA: Diagnosis not present

## 2024-01-29 DIAGNOSIS — E038 Other specified hypothyroidism: Secondary | ICD-10-CM

## 2024-01-29 DIAGNOSIS — Z87898 Personal history of other specified conditions: Secondary | ICD-10-CM | POA: Diagnosis not present

## 2024-01-29 LAB — COMPREHENSIVE METABOLIC PANEL WITH GFR
ALT: 22 U/L (ref 3–35)
AST: 21 U/L (ref 5–37)
Albumin: 4.8 g/dL (ref 3.5–5.2)
Alkaline Phosphatase: 111 U/L (ref 39–117)
BUN: 15 mg/dL (ref 6–23)
CO2: 32 meq/L (ref 19–32)
Calcium: 10.3 mg/dL (ref 8.4–10.5)
Chloride: 103 meq/L (ref 96–112)
Creatinine, Ser: 0.77 mg/dL (ref 0.40–1.20)
GFR: 80.01 mL/min
Glucose, Bld: 114 mg/dL — ABNORMAL HIGH (ref 70–99)
Potassium: 3.8 meq/L (ref 3.5–5.1)
Sodium: 141 meq/L (ref 135–145)
Total Bilirubin: 0.8 mg/dL (ref 0.2–1.2)
Total Protein: 6.9 g/dL (ref 6.0–8.3)

## 2024-01-29 LAB — VITAMIN D 25 HYDROXY (VIT D DEFICIENCY, FRACTURES): VITD: 27.3 ng/mL — ABNORMAL LOW (ref 30.00–100.00)

## 2024-01-29 LAB — TSH: TSH: 6.11 u[IU]/mL — ABNORMAL HIGH (ref 0.35–5.50)

## 2024-01-29 LAB — LIPID PANEL
Cholesterol: 187 mg/dL (ref 28–200)
HDL: 47.3 mg/dL
LDL Cholesterol: 103 mg/dL — ABNORMAL HIGH (ref 10–99)
NonHDL: 139.91
Total CHOL/HDL Ratio: 4
Triglycerides: 183 mg/dL — ABNORMAL HIGH (ref 10.0–149.0)
VLDL: 36.6 mg/dL (ref 0.0–40.0)

## 2024-01-29 LAB — HEMOGLOBIN A1C: Hgb A1c MFr Bld: 6.9 % — ABNORMAL HIGH (ref 4.6–6.5)

## 2024-01-29 LAB — T3, FREE: T3, Free: 3.5 pg/mL (ref 2.3–4.2)

## 2024-01-29 LAB — T4, FREE: Free T4: 0.88 ng/dL (ref 0.60–1.60)

## 2024-01-30 ENCOUNTER — Ambulatory Visit: Payer: Self-pay | Admitting: Family Medicine

## 2024-01-30 LAB — PTH, INTACT AND CALCIUM
Calcium: 10.3 mg/dL (ref 8.6–10.4)
PTH: 30 pg/mL (ref 16–77)

## 2024-01-30 NOTE — Progress Notes (Signed)
 No critical labs need to be addressed urgently. We will discuss labs in detail at upcoming office visit.

## 2024-02-03 ENCOUNTER — Encounter

## 2024-02-04 ENCOUNTER — Ambulatory Visit
Admission: RE | Admit: 2024-02-04 | Discharge: 2024-02-04 | Disposition: A | Source: Ambulatory Visit | Attending: Hematology and Oncology

## 2024-02-04 DIAGNOSIS — D0512 Intraductal carcinoma in situ of left breast: Secondary | ICD-10-CM

## 2024-02-06 ENCOUNTER — Ambulatory Visit (INDEPENDENT_AMBULATORY_CARE_PROVIDER_SITE_OTHER): Admitting: Family Medicine

## 2024-02-06 ENCOUNTER — Encounter: Payer: Self-pay | Admitting: Family Medicine

## 2024-02-06 VITALS — BP 120/72 | HR 65 | Temp 97.1°F | Ht 66.25 in | Wt 200.5 lb

## 2024-02-06 DIAGNOSIS — I10 Essential (primary) hypertension: Secondary | ICD-10-CM | POA: Diagnosis not present

## 2024-02-06 DIAGNOSIS — E6609 Other obesity due to excess calories: Secondary | ICD-10-CM

## 2024-02-06 DIAGNOSIS — E66811 Obesity, class 1: Secondary | ICD-10-CM | POA: Diagnosis not present

## 2024-02-06 DIAGNOSIS — E785 Hyperlipidemia, unspecified: Secondary | ICD-10-CM

## 2024-02-06 DIAGNOSIS — Z6831 Body mass index (BMI) 31.0-31.9, adult: Secondary | ICD-10-CM | POA: Diagnosis not present

## 2024-02-06 DIAGNOSIS — E038 Other specified hypothyroidism: Secondary | ICD-10-CM | POA: Diagnosis not present

## 2024-02-06 DIAGNOSIS — Z0000 Encounter for general adult medical examination without abnormal findings: Secondary | ICD-10-CM | POA: Diagnosis not present

## 2024-02-06 DIAGNOSIS — R7303 Prediabetes: Secondary | ICD-10-CM

## 2024-02-06 DIAGNOSIS — D0512 Intraductal carcinoma in situ of left breast: Secondary | ICD-10-CM | POA: Diagnosis not present

## 2024-02-06 NOTE — Assessment & Plan Note (Signed)
Stable, chronic.  Continue current medication.  Amlodipine 10 mg daily

## 2024-02-06 NOTE — Assessment & Plan Note (Signed)
 Diagnosed breast cancer 03/2022 DCIS left breast, ER and PR positive  She is status postlumpectomy with negative margins and adjuvant radiation.  She is now on adjuvant anastrozole .

## 2024-02-06 NOTE — Assessment & Plan Note (Signed)
"   Now in DM range but pt has been excessively snacking in last 3 months... will hold off on making Dx of diabetes... unless persistently elevated A1C in 3 months with return to normal lifestyle. Encouraged exercise, weight loss, healthy eating habits.  "

## 2024-02-06 NOTE — Assessment & Plan Note (Signed)
Encouraged exercise, weight loss, healthy eating habits. Associated with hypertension or hyperlipidemia.

## 2024-02-06 NOTE — Assessment & Plan Note (Signed)
Stable, chronic.  Continue current medication.  Levo 75 mcg p.o. daily

## 2024-02-06 NOTE — Assessment & Plan Note (Signed)
 Previously abnormal now in the normal range.  Normal PTH.

## 2024-02-06 NOTE — Progress Notes (Signed)
 "   Patient ID: Elizabeth Fitzpatrick, female    DOB: [birth year], 69 y.o.   MRN: [id number]  This visit was conducted in person.  BP 120/72   Pulse 65   Temp (!) 97.1 F (36.2 C) (Temporal)   Ht 5' 6.25 (1.683 m)   Wt 200 lb 8 oz (90.9 kg)   SpO2 98%   BMI 32.12 kg/m    CC:  Chief Complaint  Patient presents with   Annual Exam    MWV scheduled for 03/02/2024    Subjective:   HPI: Elizabeth Fitzpatrick is a 69 y.o. female presenting on 02/06/2024 for  welcome to medicare wellness The patient presents for complete physical and review of chronic health problems. He/She also has the following acute concerns today:  She has not been eating well.. caring for her ill sister.  Snacking at night.  The patient will see  a LPN or RN for medicare wellness visit. 03/02/2024  Prevention and wellness will be reviewed in detail. Note will be reviewed and important notes copied below.  Breast cancer 03/2022 DCIS left breast, ER and PR positive  She is status postlumpectomy with negative margins and adjuvant radiation.   She has started anastrozole   x 5 years... tolerating well overall.  Oncology Dr. Loretha   Reviewed labs in detail with patient.  Flowsheet Row Clinical Support from 02/28/2023 in [facility] [place]  PHQ-2 Total Score 0    Hypertension:  Stable well-controlled on amlodipine  10 mg p.o. daily  BP Readings from Last 3 Encounters:  02/06/24 120/72  06/05/23 125/64  04/02/23 137/74  Using medication without problems or lightheadedness:  Chest pain with exertion: none Edema:none Short of breath: none Average home BPs: Other issues:  Exercise rare  Diet: moderate,  snacking  She has been focusing on care giving for her sister.    High cholesterol: LDL almost at goal less than 100 on simvastatin  20 mg p.o. daily.. has not been eating well Lab Results  Component Value Date   CHOL 187 01/29/2024   HDL 47.30 01/29/2024   LDLCALC 103 (H) 01/29/2024    LDLDIRECT 151.0 11/08/2017   TRIG 183.0 (H) 01/29/2024   CHOLHDL 4 01/29/2024   The 10-year ASCVD risk score (Arnett DK, et al., 2019) is: 8.3%   Values used to calculate the score:     Age: 68 years     Clinically relevant sex: Female     Is Non-Hispanic African American: No     Diabetic: No     Tobacco smoker: No     Systolic Blood Pressure: 120 mmHg     Is BP treated: Yes     HDL Cholesterol: 47.3 mg/dL     Total Cholesterol: 187 mg/dL  Wt Readings from Last 3 Encounters:  02/06/24 200 lb 8 oz (90.9 kg)  06/05/23 192 lb (87.1 kg)  05/07/23 192 lb (87.1 kg)  Body mass index is 32.12 kg/m.   Hypothyroidism stable well-controlled on levo 75 mcg daily..  Free T3 and free T4 normal. Lab Results  Component Value Date   TSH 6.11 (H) 01/29/2024   Prediabetes now in diabetes range Lab Results  Component Value Date   HGBA1C 6.9 (H) 01/29/2024    Previously elevated calcium now in the normal range, normal PTH      Relevant past medical, surgical, family and social history reviewed and updated as indicated. Interim medical history since our last visit reviewed. Allergies and medications reviewed  and updated. Outpatient Medications Prior to Visit  Medication Sig Dispense Refill   amLODipine  (NORVASC ) 10 MG tablet TAKE 1 TABLET (10 MG TOTAL) BY MOUTH DAILY. 90 tablet 3   anastrozole  (ARIMIDEX ) 1 MG tablet TAKE 1 TABLET BY MOUTH DAILY. 90 tablet 3   Cholecalciferol (VITAMIN D3) 50 MCG (2000 UT) capsule Take 2,000 Units by mouth daily.     levothyroxine  (SYNTHROID ) 75 MCG tablet TAKE 1 TABLET (75 MCG TOTAL) BY MOUTH DAILY. 90 tablet 3   Multiple Vitamins-Minerals (CENTRUM MINIS WOMEN 50+) TABS Take 1,000 tablets by mouth 1 day or 1 dose. Has Vit D 1,000IU     simvastatin  (ZOCOR ) 20 MG tablet TAKE 1 TABLET (20 MG TOTAL) BY MOUTH AT BEDTIME. 90 tablet 3   No facility-administered medications prior to visit.     Per HPI unless specifically indicated in ROS section below Review  of Systems  Constitutional:  Negative for fatigue and fever.  HENT:  Negative for congestion.   Eyes:  Negative for pain.  Respiratory:  Negative for cough and shortness of breath.   Cardiovascular:  Negative for chest pain, palpitations and leg swelling.  Gastrointestinal:  Negative for abdominal pain.  Genitourinary:  Negative for dysuria and vaginal bleeding.  Musculoskeletal:  Negative for back pain.  Neurological:  Negative for syncope, light-headedness and headaches.  Psychiatric/Behavioral:  Negative for dysphoric mood.    Objective:  BP 120/72   Pulse 65   Temp (!) 97.1 F (36.2 C) (Temporal)   Ht 5' 6.25 (1.683 m)   Wt 200 lb 8 oz (90.9 kg)   SpO2 98%   BMI 32.12 kg/m   Wt Readings from Last 3 Encounters:  02/06/24 200 lb 8 oz (90.9 kg)  06/05/23 192 lb (87.1 kg)  05/07/23 192 lb (87.1 kg)      Physical Exam Vitals and nursing note reviewed.  Constitutional:      General: She is not in acute distress.    Appearance: Normal appearance. She is well-developed. She is not ill-appearing or toxic-appearing.  HENT:     Head: Normocephalic.     Right Ear: Hearing, tympanic membrane, ear canal and external ear normal.     Left Ear: Hearing, tympanic membrane, ear canal and external ear normal.     Nose: Nose normal.  Eyes:     General: Lids are normal. Lids are everted, no foreign bodies appreciated.     Conjunctiva/sclera: Conjunctivae normal.     Pupils: Pupils are equal, round, and reactive to light.  Neck:     Thyroid : No thyroid  mass or thyromegaly.     Vascular: No carotid bruit.     Trachea: Trachea normal.  Cardiovascular:     Rate and Rhythm: Normal rate and regular rhythm.     Heart sounds: Normal heart sounds, S1 normal and S2 normal. No murmur heard.    No gallop.  Pulmonary:     Effort: Pulmonary effort is normal. No respiratory distress.     Breath sounds: Normal breath sounds. No wheezing, rhonchi or rales.  Abdominal:     General: Bowel sounds  are normal. There is no distension or abdominal bruit.     Palpations: Abdomen is soft. There is no fluid wave or mass.     Tenderness: There is no abdominal tenderness. There is no guarding or rebound.     Hernia: No hernia is present.  Musculoskeletal:     Cervical back: Normal range of motion and neck supple.  Left knee: Crepitus present. No swelling, deformity, erythema or bony tenderness. Decreased range of motion. No tenderness. Normal alignment, normal meniscus and normal patellar mobility.     Instability Tests: Anterior drawer test negative. Posterior drawer test negative. Anterior Lachman test negative. Medial McMurray test negative and lateral McMurray test negative.     Comments: Pain with flexion  Antalgic, slow gait.  Lymphadenopathy:     Cervical: No cervical adenopathy.  Skin:    General: Skin is warm and dry.     Findings: No rash.  Neurological:     Mental Status: She is alert.     Cranial Nerves: No cranial nerve deficit.     Sensory: No sensory deficit.  Psychiatric:        Mood and Affect: Mood is not anxious or depressed.        Speech: Speech normal.        Behavior: Behavior normal. Behavior is cooperative.        Judgment: Judgment normal.       Results for orders placed or performed in visit on 01/29/24  VITAMIN D  25 Hydroxy (Vit-D Deficiency, Fractures)   Collection Time: 01/29/24 10:12 AM  Result Value Ref Range   VITD 27.30 (L) 30.00 - 100.00 ng/mL  PTH, Intact and Calcium   Collection Time: 01/29/24 10:12 AM  Result Value Ref Range   PTH 30 16 - 77 pg/mL   Calcium 10.3 8.6 - 10.4 mg/dL  T4, free   Collection Time: 01/29/24 10:12 AM  Result Value Ref Range   Free T4 0.88 0.60 - 1.60 ng/dL  T3, free   Collection Time: 01/29/24 10:12 AM  Result Value Ref Range   T3, Free 3.5 2.3 - 4.2 pg/mL  TSH   Collection Time: 01/29/24 10:12 AM  Result Value Ref Range   TSH 6.11 (H) 0.35 - 5.50 uIU/mL  Lipid panel   Collection Time: 01/29/24 10:12 AM   Result Value Ref Range   Cholesterol 187 28 - 200 mg/dL   Triglycerides 816.9 (H) 10.0 - 149.0 mg/dL   HDL 52.69 >60.99 mg/dL   VLDL 63.3 0.0 - 59.9 mg/dL   LDL Cholesterol 896 (H) 10 - 99 mg/dL   Total CHOL/HDL Ratio 4    NonHDL 139.91   Hemoglobin A1c   Collection Time: 01/29/24 10:12 AM  Result Value Ref Range   Hgb A1c MFr Bld 6.9 (H) 4.6 - 6.5 %  Comprehensive metabolic panel   Collection Time: 01/29/24 10:12 AM  Result Value Ref Range   Sodium 141 135 - 145 mEq/L   Potassium 3.8 3.5 - 5.1 mEq/L   Chloride 103 96 - 112 mEq/L   CO2 32 19 - 32 mEq/L   Glucose, Bld 114 (H) 70 - 99 mg/dL   BUN 15 6 - 23 mg/dL   Creatinine, Ser 9.22 0.40 - 1.20 mg/dL   Total Bilirubin 0.8 0.2 - 1.2 mg/dL   Alkaline Phosphatase 111 39 - 117 U/L   AST 21 5 - 37 U/L   ALT 22 3 - 35 U/L   Total Protein 6.9 6.0 - 8.3 g/dL   Albumin 4.8 3.5 - 5.2 g/dL   GFR 19.98 >39.99 mL/min   Calcium 10.3 8.4 - 10.5 mg/dL     COVID 19 screen:  No recent travel or known exposure to COVID19 The patient denies respiratory symptoms of COVID 19 at this time. The importance of social distancing was discussed today.   Assessment and Plan The patient's preventative  maintenance and recommended screening tests for an annual wellness exam were reviewed in full today. Brought up to date unless services declined.  Counselled on the importance of diet, exercise, and its role in overall health and mortality. The patient's FH and SH was reviewed, including their home life, tobacco status, and drug and alcohol status.   Vaccines: COVID x 3 and shingles uptodate. Uptodate tdap .flu vaccine had in 09/2021.. due for Prevnar 20 Pap/DVE:  Per GYN last 2019, further indicated. Mammo:    02/04/2024 stable post breast cancer. Bone Density:  normal 2020, repeat  09/2022 borderline osteopenia, repeat in 2 years per onc on estrogen blocker. Colon: 06/05/2023 Dr. Nandigam, repeat in 5 years.  Smoking Status:none ETOH/ drug  ldz:wnwz/wnwz  Hep C: done   Problem List Items Addressed This Visit     Calcium blood increased   Previously abnormal now in the normal range.  Normal PTH.      Class 1 obesity due to excess calories with serious comorbidity and body mass index (BMI) of 31.0 to 31.9 in adult   Encouraged exercise, weight loss, healthy eating habits. Associated with hypertension or hyperlipidemia.      Ductal carcinoma in situ (DCIS) of left breast   Diagnosed breast cancer 03/2022 DCIS left breast, ER and PR positive  She is status postlumpectomy with negative margins and adjuvant radiation.  She is now on adjuvant anastrozole .        Hyperlipidemia (Chronic)   Improved chronic.  Continue current medication.  Work on low cholesterol heart healthy diet, regular walking.  Simvastatin  20 mg daily      Hypertension (Chronic)   Stable, chronic.  Continue current medication.  Amlodipine  10 mg daily      Hypothyroidism (Chronic)   Stable, chronic.  Continue current medication.  Levo 75 mcg p.o. daily      Prediabetes    Now in DM range but pt has been excessively snacking in last 3 months... will hold off on making Dx of diabetes... unless persistently elevated A1C in 3 months with return to normal lifestyle. Encouraged exercise, weight loss, healthy eating habits.       Other Visit Diagnoses       Routine general medical examination at a health care facility    -  Primary          Greig Ring, MD   "

## 2024-02-06 NOTE — Assessment & Plan Note (Signed)
 Improved chronic.  Continue current medication.  Work on low cholesterol heart healthy diet, regular walking.  Simvastatin  20 mg daily

## 2024-02-19 ENCOUNTER — Other Ambulatory Visit: Payer: Self-pay | Admitting: Family Medicine

## 2024-03-02 ENCOUNTER — Ambulatory Visit: Payer: Medicare Other

## 2024-04-02 ENCOUNTER — Ambulatory Visit: Admitting: Hematology and Oncology

## 2024-05-06 ENCOUNTER — Ambulatory Visit: Admitting: Family Medicine

## 2025-02-02 ENCOUNTER — Other Ambulatory Visit

## 2025-02-09 ENCOUNTER — Encounter: Admitting: Family Medicine
